# Patient Record
Sex: Female | Born: 1975 | Race: White | Hispanic: No | Marital: Single | Smoking: Never smoker
Health system: Southern US, Community
[De-identification: ages and names within clinical notes are randomized; demographics above are authoritative.]

## PROBLEM LIST (undated history)

## (undated) DIAGNOSIS — F329 Major depressive disorder, single episode, unspecified: Secondary | ICD-10-CM

## (undated) DIAGNOSIS — G4730 Sleep apnea, unspecified: Secondary | ICD-10-CM

## (undated) DIAGNOSIS — I10 Essential (primary) hypertension: Secondary | ICD-10-CM

## (undated) DIAGNOSIS — R569 Unspecified convulsions: Secondary | ICD-10-CM

## (undated) DIAGNOSIS — F32A Depression, unspecified: Secondary | ICD-10-CM

---
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
## 2005-08-02 ENCOUNTER — Emergency Department: Payer: Self-pay | Admitting: Emergency Medicine

## 2006-01-21 ENCOUNTER — Emergency Department: Payer: Self-pay | Admitting: Unknown Physician Specialty

## 2006-04-18 ENCOUNTER — Emergency Department: Payer: Self-pay | Admitting: Emergency Medicine

## 2007-12-29 ENCOUNTER — Emergency Department: Payer: Self-pay | Admitting: Emergency Medicine

## 2012-02-17 DIAGNOSIS — F32A Depression, unspecified: Secondary | ICD-10-CM | POA: Insufficient documentation

## 2012-03-11 DIAGNOSIS — G43909 Migraine, unspecified, not intractable, without status migrainosus: Secondary | ICD-10-CM | POA: Insufficient documentation

## 2012-03-11 DIAGNOSIS — I10 Essential (primary) hypertension: Secondary | ICD-10-CM | POA: Insufficient documentation

## 2012-06-14 ENCOUNTER — Ambulatory Visit: Payer: Self-pay | Admitting: Neurology

## 2014-02-24 DIAGNOSIS — E781 Pure hyperglyceridemia: Secondary | ICD-10-CM | POA: Insufficient documentation

## 2015-04-16 LAB — HM HIV SCREENING LAB: HM HIV Screening: NEGATIVE

## 2015-04-25 DIAGNOSIS — Z8619 Personal history of other infectious and parasitic diseases: Secondary | ICD-10-CM | POA: Insufficient documentation

## 2016-09-16 ENCOUNTER — Other Ambulatory Visit: Payer: Self-pay | Admitting: Family Medicine

## 2016-09-16 DIAGNOSIS — Z1231 Encounter for screening mammogram for malignant neoplasm of breast: Secondary | ICD-10-CM

## 2016-09-24 ENCOUNTER — Ambulatory Visit
Admission: RE | Admit: 2016-09-24 | Discharge: 2016-09-24 | Disposition: A | Discharge status: Home / Self Care | Payer: BC Managed Care – PPO | Source: Ambulatory Visit | Attending: Family Medicine | Admitting: Family Medicine

## 2016-09-24 ENCOUNTER — Encounter: Payer: Self-pay | Admitting: Radiology

## 2016-09-24 DIAGNOSIS — Z1231 Encounter for screening mammogram for malignant neoplasm of breast: Secondary | ICD-10-CM | POA: Diagnosis present

## 2018-01-19 ENCOUNTER — Other Ambulatory Visit: Payer: Self-pay | Admitting: Family Medicine

## 2018-01-19 DIAGNOSIS — Z1231 Encounter for screening mammogram for malignant neoplasm of breast: Secondary | ICD-10-CM

## 2018-02-03 ENCOUNTER — Ambulatory Visit
Admission: RE | Admit: 2018-02-03 | Discharge: 2018-02-03 | Disposition: A | Discharge status: Home / Self Care | Payer: BC Managed Care – PPO | Source: Ambulatory Visit | Attending: Family Medicine | Admitting: Family Medicine

## 2018-02-03 DIAGNOSIS — Z1231 Encounter for screening mammogram for malignant neoplasm of breast: Secondary | ICD-10-CM | POA: Insufficient documentation

## 2018-02-03 DIAGNOSIS — R928 Other abnormal and inconclusive findings on diagnostic imaging of breast: Secondary | ICD-10-CM | POA: Insufficient documentation

## 2018-02-08 ENCOUNTER — Other Ambulatory Visit: Payer: Self-pay | Admitting: Family Medicine

## 2018-02-08 DIAGNOSIS — R928 Other abnormal and inconclusive findings on diagnostic imaging of breast: Secondary | ICD-10-CM

## 2018-02-08 DIAGNOSIS — N6489 Other specified disorders of breast: Secondary | ICD-10-CM

## 2018-02-10 ENCOUNTER — Other Ambulatory Visit: Payer: Self-pay | Admitting: Neurology

## 2018-02-10 DIAGNOSIS — R569 Unspecified convulsions: Secondary | ICD-10-CM

## 2018-02-11 ENCOUNTER — Ambulatory Visit
Admission: RE | Admit: 2018-02-11 | Discharge: 2018-02-11 | Disposition: A | Discharge status: Home / Self Care | Payer: BC Managed Care – PPO | Source: Ambulatory Visit | Attending: Family Medicine | Admitting: Family Medicine

## 2018-02-11 DIAGNOSIS — N6489 Other specified disorders of breast: Secondary | ICD-10-CM | POA: Insufficient documentation

## 2018-02-11 DIAGNOSIS — R928 Other abnormal and inconclusive findings on diagnostic imaging of breast: Secondary | ICD-10-CM

## 2018-02-17 ENCOUNTER — Ambulatory Visit: Payer: BC Managed Care – PPO

## 2018-03-04 DIAGNOSIS — G40409 Other generalized epilepsy and epileptic syndromes, not intractable, without status epilepticus: Secondary | ICD-10-CM | POA: Insufficient documentation

## 2018-03-05 ENCOUNTER — Ambulatory Visit
Admission: RE | Admit: 2018-03-05 | Discharge: 2018-03-05 | Disposition: A | Discharge status: Home / Self Care | Payer: BC Managed Care – PPO | Source: Ambulatory Visit | Attending: Neurology | Admitting: Neurology

## 2018-03-05 DIAGNOSIS — R569 Unspecified convulsions: Secondary | ICD-10-CM | POA: Insufficient documentation

## 2018-03-05 LAB — POCT I-STAT CREATININE: CREATININE: 0.7 mg/dL (ref 0.44–1.00)

## 2018-03-05 MED ORDER — GADOBENATE DIMEGLUMINE 529 MG/ML IV SOLN
15.0000 mL | Freq: Once | INTRAVENOUS | Status: AC | PRN
  Administered 2018-03-05: 15 mL via INTRAVENOUS

## 2018-05-17 DIAGNOSIS — B001 Herpesviral vesicular dermatitis: Secondary | ICD-10-CM | POA: Insufficient documentation

## 2018-09-23 ENCOUNTER — Other Ambulatory Visit: Payer: Self-pay | Admitting: Family Medicine

## 2018-09-23 DIAGNOSIS — R928 Other abnormal and inconclusive findings on diagnostic imaging of breast: Secondary | ICD-10-CM

## 2018-10-12 ENCOUNTER — Ambulatory Visit
Admission: RE | Admit: 2018-10-12 | Discharge: 2018-10-12 | Disposition: A | Discharge status: Home / Self Care | Payer: BC Managed Care – PPO | Source: Ambulatory Visit | Attending: Family Medicine | Admitting: Family Medicine

## 2018-10-12 DIAGNOSIS — R928 Other abnormal and inconclusive findings on diagnostic imaging of breast: Secondary | ICD-10-CM

## 2018-10-13 ENCOUNTER — Ambulatory Visit: Payer: BC Managed Care – PPO | Attending: Neurology

## 2018-10-13 DIAGNOSIS — G4733 Obstructive sleep apnea (adult) (pediatric): Secondary | ICD-10-CM | POA: Insufficient documentation

## 2018-10-13 DIAGNOSIS — G4710 Hypersomnia, unspecified: Secondary | ICD-10-CM | POA: Diagnosis present

## 2018-10-13 DIAGNOSIS — G4730 Sleep apnea, unspecified: Secondary | ICD-10-CM | POA: Diagnosis present

## 2019-05-18 ENCOUNTER — Other Ambulatory Visit: Payer: Self-pay | Admitting: Family Medicine

## 2019-05-18 DIAGNOSIS — Z1231 Encounter for screening mammogram for malignant neoplasm of breast: Secondary | ICD-10-CM

## 2019-07-01 ENCOUNTER — Ambulatory Visit
Admission: RE | Admit: 2019-07-01 | Discharge: 2019-07-01 | Disposition: A | Discharge status: Home / Self Care | Payer: BC Managed Care – PPO | Source: Ambulatory Visit | Attending: Family Medicine | Admitting: Family Medicine

## 2019-07-01 ENCOUNTER — Other Ambulatory Visit: Payer: Self-pay

## 2019-07-01 DIAGNOSIS — Z1231 Encounter for screening mammogram for malignant neoplasm of breast: Secondary | ICD-10-CM | POA: Diagnosis present

## 2020-01-28 ENCOUNTER — Ambulatory Visit: Payer: BC Managed Care – PPO

## 2020-04-20 ENCOUNTER — Ambulatory Visit (INDEPENDENT_AMBULATORY_CARE_PROVIDER_SITE_OTHER): Payer: BC Managed Care – PPO

## 2020-04-20 ENCOUNTER — Ambulatory Visit
Admission: EM | Admit: 2020-04-20 | Discharge: 2020-04-20 | Disposition: A | Discharge status: Home / Self Care | Payer: BC Managed Care – PPO

## 2020-04-20 ENCOUNTER — Other Ambulatory Visit: Payer: Self-pay

## 2020-04-20 DIAGNOSIS — J209 Acute bronchitis, unspecified: Secondary | ICD-10-CM | POA: Diagnosis not present

## 2020-04-20 HISTORY — DX: Depression, unspecified: F32.A

## 2020-04-20 HISTORY — DX: Unspecified convulsions: R56.9

## 2020-04-20 HISTORY — DX: Essential (primary) hypertension: I10

## 2020-04-20 MED ORDER — DOXYCYCLINE HYCLATE 100 MG PO CAPS
100.0000 mg | ORAL_CAPSULE | Freq: Two times a day (BID) | ORAL | 0 refills | Status: AC
Start: 2020-04-20 — End: 2020-04-27

## 2020-04-20 MED ORDER — PROMETHAZINE-DM 6.25-15 MG/5ML PO SYRP
5.0000 mL | ORAL_SOLUTION | Freq: Four times a day (QID) | ORAL | 0 refills | Status: DC | PRN
Start: 2020-04-20 — End: 2024-03-22

## 2020-04-20 MED ORDER — ALBUTEROL SULFATE HFA 108 (90 BASE) MCG/ACT IN AERS: 1.0000 | INHALATION_SPRAY | Freq: Four times a day (QID) | RESPIRATORY_TRACT | 0 refills | Status: DC | PRN

## 2020-04-20 MED ORDER — DM-GUAIFENESIN ER 30-600 MG PO TB12
1.0000 | ORAL_TABLET | Freq: Two times a day (BID) | ORAL | 0 refills | Status: AC
Start: 2020-04-20 — End: 2020-04-30

## 2020-04-20 MED ORDER — PREDNISONE 20 MG PO TABS
40.0000 mg | ORAL_TABLET | Freq: Every day | ORAL | 0 refills | Status: AC
Start: 2020-04-20 — End: 2020-04-25

## 2020-04-20 NOTE — ED Provider Notes (Signed)
[facility]    CSN: [id number] Arrival date & time: 04/20/20  0908      History   Chief Complaint Chief Complaint  Patient presents with  . Cough    HPI Benna Pinkus is a 44 y.o. female.   Subjective:   Deatra Zollinger is a 44 y.o. female here for evaluation of a cough. Onset of symptoms was 1 week ago and has been gradually worsening since that time.  The cough is aggravated by reclining position. The cough is productive with thick yellow sputum.  Associated symptoms include shortness of breath and the inability to take a deep breath.  She denies any fevers, sweats, chills, wheezing, sore throat, nausea, vomiting, diarrhea, headache, leg swelling, dizziness or change in taste/smell.  She denies any known exposure to COVID-19.  She has never had COVID-19.  She has had full COVID vaccination with the second dose occurring on 03/21/2020.  Patient does not have a history of asthma. Patient has not had recent travel. Patient does not have a history of smoking. Patient  has not had a previous chest x-ray.   The following portions of the patient's history were reviewed and updated as appropriate: allergies, current medications, past family history, past medical history, past social history, past surgical history and problem list.       Past Medical History:  Diagnosis Date  . Depression   . Hypertension   . Seizures (HCC)     There are no problems to display for this patient.   History reviewed. No pertinent surgical history.  OB History   No obstetric history on file.      Home Medications    Prior to Admission medications   Medication Sig Start Date End Date Taking? Authorizing Provider  albuterol (VENTOLIN HFA) 108 (90 Base) MCG/ACT inhaler Inhale 1-2 puffs into the lungs every 6 (six) hours as needed for wheezing or shortness of breath. 04/20/20   Lurline Idol, FNP  dextromethorphan-guaiFENesin [facility] DM) 30-600 MG 12hr tablet Take 1 tablet by mouth 2  (two) times daily for 10 days. 04/20/20 04/30/20  Lurline Idol, FNP  doxycycline (VIBRAMYCIN) 100 MG capsule Take 1 capsule (100 mg total) by mouth 2 (two) times daily for 7 days. 04/20/20 04/27/20  Lurline Idol, FNP  EPIDUO FORTE 0.3-2.5 % GEL Apply 1 application topically at bedtime. 01/30/20   [provider]  levETIRAcetam (KEPPRA) 500 MG tablet Take 2 tablets in the morning and 1 tablet in the evening 02/11/20   [provider]  levonorgestrel-ethinyl estradiol (SEASONALE) 0.15-0.03 MG tablet Take 1 tablet by mouth daily. 12/19/19   [provider]  lisinopril-hydrochlorothiazide (ZESTORETIC) 10-12.5 MG tablet Take 1 tablet by mouth daily. 02/11/20   [provider]  predniSONE (DELTASONE) 20 MG tablet Take 2 tablets (40 mg total) by mouth daily for 5 days. 04/20/20 04/25/20  Lurline Idol, FNP  promethazine-dextromethorphan (PROMETHAZINE-DM) 6.25-15 MG/5ML syrup Take 5 mLs by mouth 4 (four) times daily as needed for cough. 04/20/20   Lurline Idol, FNP  venlafaxine XR (EFFEXOR-XR) 150 MG 24 hr capsule Take 150 mg by mouth daily. 03/19/20   [provider]    Family History Family History  Problem Relation Age of Onset  . Hypertension Mother   . Breast cancer Neg Hx     Social History Social History   Tobacco Use  . Smoking status: Never Smoker  . Smokeless tobacco: Never Used  Substance Use Topics  . Alcohol use: Yes  . Drug use:  Never     Allergies   Patient has no known allergies.   Review of Systems Review of Systems  Constitutional: Negative for fever.  HENT: Negative for congestion, rhinorrhea, sinus pressure, sneezing and sore throat.   Eyes: Negative.   Respiratory: Positive for cough, chest tightness and shortness of breath. Negative for wheezing.   Cardiovascular: Negative for palpitations and leg swelling.  Gastrointestinal: Negative for diarrhea, nausea and vomiting.  Skin: Negative for rash.  Neurological:  Negative for dizziness and headaches.  All other systems reviewed and are negative.    Physical Exam Triage Vital Signs ED Triage Vitals  Enc Vitals Group     BP 04/20/20 0927 128/82     Pulse Rate 04/20/20 0927 95     Resp 04/20/20 0927 18     Temp 04/20/20 0927 98.7 F (37.1 C)     Temp Source 04/20/20 0927 Oral     SpO2 04/20/20 0927 98 %     Weight 04/20/20 0923 170 lb (77.1 kg)     Height 04/20/20 0923 5\' 1"  (1.549 m)     Head Circumference --      Peak Flow --      Pain Score 04/20/20 0923 0     Pain Loc --      Pain Edu? --      Excl. in GC? --    No data found.  Updated Vital Signs BP 128/82 (BP Location: Left Arm)   Pulse 95   Temp 98.7 F (37.1 C) (Oral)   Resp 18   Ht 5\' 1"  (1.549 m)   Wt 170 lb (77.1 kg)   LMP 04/05/2020   SpO2 98%   BMI 32.12 kg/m   Visual Acuity Right Eye Distance:   Left Eye Distance:   Bilateral Distance:    Right Eye Near:   Left Eye Near:    Bilateral Near:     Physical Exam Vitals reviewed.  Constitutional:      General: She is not in acute distress.    Appearance: Normal appearance. She is not ill-appearing, toxic-appearing or diaphoretic.  HENT:     Head: Normocephalic.  Cardiovascular:     Rate and Rhythm: Normal rate and regular rhythm.  Pulmonary:     Effort: Pulmonary effort is normal.     Breath sounds: Normal breath sounds.  Musculoskeletal:        General: Normal range of motion.     Cervical back: Normal range of motion and neck supple. No tenderness.  Lymphadenopathy:     Cervical: No cervical adenopathy.  Skin:    General: Skin is warm and dry.  Neurological:     General: No focal deficit present.     Mental Status: She is alert and oriented to person, place, and time.  Psychiatric:        Mood and Affect: Mood normal.      UC Treatments / Results  Labs (all labs ordered are listed, but only abnormal results are displayed) Labs Reviewed - No data to display  EKG   Radiology DG Chest 2  View  Result Date: 04/20/2020 CLINICAL DATA:  Cough EXAM: CHEST - 2 VIEW COMPARISON:  None. FINDINGS: The heart size and mediastinal contours are within normal limits. Both lungs are clear. The visualized skeletal structures are unremarkable. IMPRESSION: No active cardiopulmonary disease. Electronically Signed   By: 04/07/2020.  Shick M.D.   On: 04/20/2020 09:52    Procedures Procedures (including critical care time)  Medications  Ordered in UC Medications - No data to display  Initial Impression / Assessment and Plan / UC Course  I have reviewed the triage vital signs and the nursing notes.  Pertinent labs & imaging results that were available during my care of the patient were reviewed by me and considered in my medical decision making (see chart for details).    44 yo female with a one-week history of progressively worsening productive cough. No fevers.  No known COVID-19 exposure.  No previous history of COVID-19.  Patient is fully vaccinated against COVID-19.  She is afebrile.  Vital signs stable.  Physical exam unremarkable.  Antibiotics, steroids and antitussives as per medication orders.  B agonist inhaler as needed. Avoid exposure to tobacco smoke and fumes. Follow-up with primary care next month as already scheduled or sooner if symptoms worsen, change in character of cough, development of fever/chills, inability to maintain nutrition or hydration.   Today's evaluation has revealed no signs of a dangerous process. Discussed diagnosis with patient and/or guardian. Patient and/or guardian aware of their diagnosis, possible red flag symptoms to watch out for and need for close follow up. Patient and/or guardian understands verbal and written discharge instructions. Patient and/or guardian comfortable with plan and disposition.  Patient and/or guardian has a clear mental status at this time, good insight into illness (after discussion and teaching) and has clear judgment to make decisions regarding  their care  This care was provided during an unprecedented National Emergency due to the Novel Coronavirus (COVID-19) pandemic. COVID-19 infections and transmission risks place heavy strains on healthcare resources.  As this pandemic evolves, our facility, providers, and staff strive to respond fluidly, to remain operational, and to provide care relative to available resources and information. Outcomes are unpredictable and treatments are without well-defined guidelines. Further, the impact of COVID-19 on all aspects of urgent care, including the impact to patients seeking care for reasons other than COVID-19, is unavoidable during this national emergency. At this time of the global pandemic, management of patients has significantly changed, even for non-COVID positive patients given high local and regional COVID volumes at this time requiring high healthcare system and resource utilization. The standard of care for management of both COVID suspected and non-COVID suspected patients continues to change rapidly at the local, regional, national, and global levels. This patient was worked up and treated to the best available but ever changing evidence and resources available at this current time.   Documentation was completed with the aid of voice recognition software. Transcription may contain typographical errors.  Final Clinical Impressions(s) / UC Diagnoses   Final diagnoses:  Acute bronchitis, unspecified organism     Discharge Instructions     There was no pneumonia noted on your chest x-ray.  We're going to start treatment today for acute bronchitis.  Take the medications as prescribed.  Drink plenty of fluids.  You may also take Tylenol and/or ibuprofen as needed for any pain.  Follow-up with your primary care provider in June as already scheduled or sooner if needed.  Go to the ED if worse.  Feel better soon Aldona Bar, [facility]      ED Prescriptions    Medication Sig Dispense Auth. Provider    doxycycline (VIBRAMYCIN) 100 MG capsule Take 1 capsule (100 mg total) by mouth 2 (two) times daily for 7 days. 14 capsule Enrique Sack, FNP   predniSONE (DELTASONE) 20 MG tablet Take 2 tablets (40 mg total) by mouth daily for 5 days. 10 tablet  Lurline Idol, FNP   dextromethorphan-guaiFENesin [facility] DM) 30-600 MG 12hr tablet Take 1 tablet by mouth 2 (two) times daily for 10 days. 20 tablet Lurline Idol, FNP   promethazine-dextromethorphan (PROMETHAZINE-DM) 6.25-15 MG/5ML syrup Take 5 mLs by mouth 4 (four) times daily as needed for cough. 118 mL Lurline Idol, FNP   albuterol (VENTOLIN HFA) 108 (90 Base) MCG/ACT inhaler Inhale 1-2 puffs into the lungs every 6 (six) hours as needed for wheezing or shortness of breath. 8 g Lurline Idol, FNP     PDMP not reviewed this encounter.   Lurline Idol, [place] 04/20/20 1013

## 2020-04-20 NOTE — ED Triage Notes (Addendum)
Pt presents with c/o cough for almost 2 weeks. Pt states it is worse in the mornings and very productive. She reports she has to prop herself up to sleep. She also states she has some wheezing. Pt has tried a DM cough syrup and vapor rub with no improvement. Pt denies fever/chills, n/v/d, sore throat or ear pain. Pt has had both COVID vaccines.

## 2020-04-20 NOTE — Discharge Instructions (Signed)
There was no pneumonia noted on your chest x-ray.  We're going to start treatment today for acute bronchitis.  Take the medications as prescribed.  Drink plenty of fluids.  You may also take Tylenol and/or ibuprofen as needed for any pain.  Follow-up with your primary care provider in June as already scheduled or sooner if needed.  Go to the ED if worse.  Feel better soon Lelon Mast, FNP-C

## 2020-05-31 ENCOUNTER — Other Ambulatory Visit: Payer: Self-pay

## 2020-05-31 DIAGNOSIS — Z1231 Encounter for screening mammogram for malignant neoplasm of breast: Secondary | ICD-10-CM

## 2020-05-31 LAB — HM PAP SMEAR: HM Pap smear: NORMAL

## 2020-05-31 LAB — RESULTS CONSOLE HPV: CHL HPV: NEGATIVE

## 2020-07-03 ENCOUNTER — Ambulatory Visit
Admission: RE | Admit: 2020-07-03 | Discharge: 2020-07-03 | Disposition: A | Discharge status: Home / Self Care | Payer: BC Managed Care – PPO | Source: Ambulatory Visit

## 2020-07-03 DIAGNOSIS — Z1231 Encounter for screening mammogram for malignant neoplasm of breast: Secondary | ICD-10-CM | POA: Diagnosis present

## 2021-05-21 ENCOUNTER — Other Ambulatory Visit: Payer: Self-pay

## 2021-05-21 DIAGNOSIS — Z1231 Encounter for screening mammogram for malignant neoplasm of breast: Secondary | ICD-10-CM

## 2021-06-13 LAB — HM HEPATITIS C SCREENING LAB: HM Hepatitis Screen: NEGATIVE

## 2021-07-04 ENCOUNTER — Ambulatory Visit
Admission: RE | Admit: 2021-07-04 | Discharge: 2021-07-04 | Disposition: A | Discharge status: Home / Self Care | Payer: BC Managed Care – PPO | Source: Ambulatory Visit

## 2021-07-04 ENCOUNTER — Other Ambulatory Visit: Payer: Self-pay

## 2021-07-04 DIAGNOSIS — Z1231 Encounter for screening mammogram for malignant neoplasm of breast: Secondary | ICD-10-CM | POA: Diagnosis present

## 2021-12-23 ENCOUNTER — Other Ambulatory Visit: Payer: Self-pay | Admitting: Physician Assistant

## 2021-12-23 DIAGNOSIS — Z1231 Encounter for screening mammogram for malignant neoplasm of breast: Secondary | ICD-10-CM

## 2021-12-23 DIAGNOSIS — R590 Localized enlarged lymph nodes: Secondary | ICD-10-CM

## 2022-01-13 ENCOUNTER — Ambulatory Visit
Admission: RE | Admit: 2022-01-13 | Discharge: 2022-01-13 | Disposition: A | Discharge status: Home / Self Care | Payer: BC Managed Care – PPO | Source: Ambulatory Visit | Attending: Physician Assistant | Admitting: Physician Assistant

## 2022-01-13 DIAGNOSIS — R590 Localized enlarged lymph nodes: Secondary | ICD-10-CM | POA: Insufficient documentation

## 2022-01-17 ENCOUNTER — Other Ambulatory Visit: Payer: Self-pay

## 2022-01-17 ENCOUNTER — Encounter: Payer: Self-pay | Admitting: Gastroenterology

## 2022-01-17 ENCOUNTER — Telehealth: Payer: Self-pay

## 2022-01-17 DIAGNOSIS — Z1211 Encounter for screening for malignant neoplasm of colon: Secondary | ICD-10-CM

## 2022-01-17 MED ORDER — NA SULFATE-K SULFATE-MG SULF 17.5-3.13-1.6 GM/177ML PO SOLN: 1.0000 | Freq: Once | ORAL | 0 refills | Status: AC

## 2022-01-17 NOTE — Progress Notes (Signed)
Gastroenterology Pre-Procedure Review  Request Date: 01/24/2022 Requesting Physician: Dr. Servando Snare  PATIENT REVIEW QUESTIONS: The patient responded to the following health history questions as indicated:    1. Are you having any GI issues? no 2. Do you have a personal history of Polyps? no 3. Do you have a family history of Colon Cancer or Polyps? no 4. Diabetes Mellitus? no 5. Joint replacements in the past 12 months?no 6. Major health problems in the past 3 months?no 7. Any artificial heart valves, MVP, or defibrillator?no    MEDICATIONS & ALLERGIES:    Patient reports the following regarding taking any anticoagulation/antiplatelet therapy:   Plavix, Coumadin, Eliquis, Xarelto, Lovenox, Pradaxa, Brilinta, or Effient? no Aspirin? no  Patient confirms/reports the following medications:  Current Outpatient Medications  Medication Sig Dispense Refill   albuterol (VENTOLIN HFA) 108 (90 Base) MCG/ACT inhaler Inhale 1-2 puffs into the lungs every 6 (six) hours as needed for wheezing or shortness of breath. 8 g 0   EPIDUO FORTE 0.3-2.5 % GEL Apply 1 application topically at bedtime.     levETIRAcetam (KEPPRA) 500 MG tablet Take 2 tablets in the morning and 1 tablet in the evening     levonorgestrel-ethinyl estradiol (SEASONALE) 0.15-0.03 MG tablet Take 1 tablet by mouth daily.     lisinopril-hydrochlorothiazide (ZESTORETIC) 10-12.5 MG tablet Take 1 tablet by mouth daily.     promethazine-dextromethorphan (PROMETHAZINE-DM) 6.25-15 MG/5ML syrup Take 5 mLs by mouth 4 (four) times daily as needed for cough. 118 mL 0   venlafaxine XR (EFFEXOR-XR) 150 MG 24 hr capsule Take 150 mg by mouth daily.     No current facility-administered medications for this visit.    Patient confirms/reports the following allergies:  No Known Allergies  No orders of the defined types were placed in this encounter.   AUTHORIZATION INFORMATION Primary Insurance: 1D#: Group #:  Secondary Insurance: 1D#: Group  #:  SCHEDULE INFORMATION: Date: 01/24/2022 Time: Location:[facility]

## 2022-01-17 NOTE — Telephone Encounter (Signed)
Scheduled for 01/24/2022

## 2022-02-10 ENCOUNTER — Ambulatory Visit: Payer: BC Managed Care – PPO | Admitting: Anesthesiology

## 2022-02-10 ENCOUNTER — Ambulatory Visit
Admission: RE | Disposition: A | Discharge status: Home / Self Care | Payer: Self-pay | Source: Home / Self Care | Attending: Gastroenterology

## 2022-02-10 ENCOUNTER — Other Ambulatory Visit: Payer: Self-pay

## 2022-02-10 ENCOUNTER — Ambulatory Visit
Admission: RE | Admit: 2022-02-10 | Discharge: 2022-02-10 | Disposition: A | Discharge status: Home / Self Care | Payer: BC Managed Care – PPO | Attending: Gastroenterology | Admitting: Gastroenterology

## 2022-02-10 ENCOUNTER — Encounter: Payer: Self-pay | Admitting: Gastroenterology

## 2022-02-10 DIAGNOSIS — K640 First degree hemorrhoids: Secondary | ICD-10-CM | POA: Insufficient documentation

## 2022-02-10 DIAGNOSIS — Z1211 Encounter for screening for malignant neoplasm of colon: Secondary | ICD-10-CM | POA: Insufficient documentation

## 2022-02-10 DIAGNOSIS — I10 Essential (primary) hypertension: Secondary | ICD-10-CM | POA: Insufficient documentation

## 2022-02-10 DIAGNOSIS — G4730 Sleep apnea, unspecified: Secondary | ICD-10-CM | POA: Diagnosis not present

## 2022-02-10 DIAGNOSIS — K635 Polyp of colon: Secondary | ICD-10-CM | POA: Diagnosis not present

## 2022-02-10 DIAGNOSIS — F32A Depression, unspecified: Secondary | ICD-10-CM | POA: Diagnosis not present

## 2022-02-10 DIAGNOSIS — R569 Unspecified convulsions: Secondary | ICD-10-CM | POA: Diagnosis not present

## 2022-02-10 HISTORY — PX: COLONOSCOPY WITH PROPOFOL: SHX5780

## 2022-02-10 HISTORY — PX: POLYPECTOMY: SHX5525

## 2022-02-10 HISTORY — DX: Sleep apnea, unspecified: G47.30

## 2022-02-10 LAB — POCT PREGNANCY, URINE: Preg Test, Ur: NEGATIVE

## 2022-02-10 SURGERY — COLONOSCOPY WITH PROPOFOL
Anesthesia: General | Site: Rectum

## 2022-02-10 MED ORDER — ONDANSETRON HCL 4 MG/2ML IJ SOLN: 4.0000 mg | Freq: Once | INTRAMUSCULAR | Status: DC | PRN

## 2022-02-10 MED ORDER — LIDOCAINE HCL (CARDIAC) PF 100 MG/5ML IV SOSY
PREFILLED_SYRINGE | INTRAVENOUS | Status: DC | PRN
  Administered 2022-02-10: 30 mg via INTRAVENOUS

## 2022-02-10 MED ORDER — ACETAMINOPHEN 160 MG/5ML PO SOLN: 975.0000 mg | Freq: Once | ORAL | Status: DC | PRN

## 2022-02-10 MED ORDER — STERILE WATER FOR IRRIGATION IR SOLN
Status: DC | PRN
  Administered 2022-02-10: 1

## 2022-02-10 MED ORDER — LACTATED RINGERS IV SOLN: INTRAVENOUS | Status: DC

## 2022-02-10 MED ORDER — ACETAMINOPHEN 500 MG PO TABS: 1000.0000 mg | ORAL_TABLET | Freq: Once | ORAL | Status: DC | PRN

## 2022-02-10 MED ORDER — SODIUM CHLORIDE 0.9 % IV SOLN: INTRAVENOUS | Status: DC

## 2022-02-10 MED ORDER — PROPOFOL 10 MG/ML IV BOLUS
INTRAVENOUS | Status: DC | PRN
  Administered 2022-02-10 (×2): 40 mg via INTRAVENOUS
  Administered 2022-02-10: 140 mg via INTRAVENOUS
  Administered 2022-02-10 (×2): 40 mg via INTRAVENOUS

## 2022-02-10 SURGICAL SUPPLY — 8 items
GOWN CVR UNV OPN BCK APRN NK (MISCELLANEOUS) ×2 IMPLANT
GOWN ISOL THUMB LOOP REG UNIV (MISCELLANEOUS) ×4
KIT PRC NS LF DISP ENDO (KITS) ×1 IMPLANT
KIT PROCEDURE OLYMPUS (KITS) ×2
MANIFOLD NEPTUNE II (INSTRUMENTS) ×2 IMPLANT
SNARE COLD EXACTO (MISCELLANEOUS) ×1 IMPLANT
TRAP ETRAP POLY (MISCELLANEOUS) ×1 IMPLANT
WATER STERILE IRR 250ML POUR (IV SOLUTION) ×2 IMPLANT

## 2022-02-10 NOTE — Anesthesia Procedure Notes (Signed)
Date/Time: 02/10/2022 7:45 AM Performed by: Maree Krabbe, CRNA Pre-anesthesia Checklist: Patient identified, Emergency Drugs available, Suction available, Timeout performed and Patient being monitored Patient Re-evaluated:Patient Re-evaluated prior to induction Oxygen Delivery Method: Nasal cannula Placement Confirmation: positive ETCO2

## 2022-02-10 NOTE — Anesthesia Postprocedure Evaluation (Signed)
Anesthesia Post Note  Patient: Deborah Coleman  Procedure(s) Performed: COLONOSCOPY WITH BIOPSY (Rectum) POLYPECTOMY (Rectum)     Patient location during evaluation: PACU Anesthesia Type: General Level of consciousness: awake and alert Pain management: pain level controlled Vital Signs Assessment: post-procedure vital signs reviewed and stable Respiratory status: spontaneous breathing, nonlabored ventilation, respiratory function stable and patient connected to nasal cannula oxygen Cardiovascular status: blood pressure returned to baseline and stable Postop Assessment: no apparent nausea or vomiting Anesthetic complications: no   No notable events documented.  Loni Beckwith

## 2022-02-10 NOTE — H&P (Signed)
Midge Minium, MD [facility] [address], [address] [place], [place] [address] Phone: [phone] Fax : [phone]  Primary Care Physician:  Marland Kitchen, FNP Primary Gastroenterologist:  Dr. Servando Snare  Pre-Procedure History & Physical: HPI:  Deborah Coleman is a 46 y.o. female is here for a screening colonoscopy.   Past Medical History:  Diagnosis Date   Depression    Hypertension    Seizures (HCC)    not for 10 years   Sleep apnea    no CPAP    History reviewed. No pertinent surgical history.  Prior to Admission medications   Medication Sig Start Date End Date Taking? Authorizing Provider  levETIRAcetam (KEPPRA) 500 MG tablet Take 2 tablets in the morning and 1 tablet in the evening 02/11/20  Yes [provider]  lisinopril-hydrochlorothiazide (ZESTORETIC) 10-12.5 MG tablet Take 1 tablet by mouth daily. 02/11/20  Yes [provider]  venlafaxine XR (EFFEXOR-XR) 150 MG 24 hr capsule Take 150 mg by mouth daily. 03/19/20  Yes [provider]  albuterol (VENTOLIN HFA) 108 (90 Base) MCG/ACT inhaler Inhale 1-2 puffs into the lungs every 6 (six) hours as needed for wheezing or shortness of breath. Patient not taking: Reported on 01/17/2022 04/20/20   Lurline Idol, FNP  EPIDUO FORTE 0.3-2.5 % GEL Apply 1 application topically at bedtime. Patient not taking: Reported on 01/17/2022 01/30/20   [provider]  levonorgestrel-ethinyl estradiol (SEASONALE) 0.15-0.03 MG tablet Take 1 tablet by mouth daily. Patient not taking: Reported on 01/17/2022 12/19/19   [provider]  promethazine-dextromethorphan (PROMETHAZINE-DM) 6.25-15 MG/5ML syrup Take 5 mLs by mouth 4 (four) times daily as needed for cough. Patient not taking: Reported on 01/17/2022 04/20/20   Lurline Idol, FNP    Allergies as of 01/17/2022   (No Known Allergies)    Family History  Problem Relation Age of Onset   Hypertension Mother    Breast cancer Neg Hx     Social History    Socioeconomic History   Marital status: Single    Spouse name: Not on file   Number of children: Not on file   Years of education: Not on file   Highest education level: Not on file  Occupational History   Not on file  Tobacco Use   Smoking status: Never   Smokeless tobacco: Never  Vaping Use   Vaping Use: Never used  Substance and Sexual Activity   Alcohol use: Yes    Comment: occasionally   Drug use: Never   Sexual activity: Not on file  Other Topics Concern   Not on file  Social History Narrative   Not on file   Social Determinants of Health   Financial Resource Strain: Not on file  Food Insecurity: Not on file  Transportation Needs: Not on file  Physical Activity: Not on file  Stress: Not on file  Social Connections: Not on file  Intimate Partner Violence: Not on file    Review of Systems: See HPI, otherwise negative ROS  Physical Exam: BP 104/71    Pulse 75    Temp 98.2 F (36.8 C) (Temporal)    Ht 5' (1.524 m)    Wt 76.7 kg    SpO2 98%    BMI 33.01 kg/m  General:   Alert,  pleasant and cooperative in NAD Head:  Normocephalic and atraumatic. Neck:  Supple; no masses or thyromegaly. Lungs:  Clear throughout to auscultation.    Heart:  Regular rate and rhythm. Abdomen:  Soft, nontender and nondistended.  Normal bowel sounds, without guarding, and without rebound.   Neurologic:  Alert and  oriented x4;  grossly normal neurologically.  Impression/Plan: Deborah Coleman is now here to undergo a screening colonoscopy.  Risks, benefits, and alternatives regarding colonoscopy have been reviewed with the patient.  Questions have been answered.  All parties agreeable.

## 2022-02-10 NOTE — Op Note (Signed)
[facility] Gastroenterology Patient Name: [facility] Procedure Date: 02/10/2022 7:22 AM MRN: [id number] Account #: [id number] Date of Birth: 1976-09-20 Admit Type: Outpatient Age: 46 Room: [facility] OR ROOM 01 Gender: Female Note Status: Finalized Instrument Name: [id number] Procedure:             Colonoscopy Indications:           Screening for colorectal malignant neoplasm Providers:             Midge Minium MD, MD Referring MD:          Harrison Mons Medicines:             Propofol per Anesthesia Complications:         No immediate complications. Procedure:             Pre-Anesthesia Assessment:                        - Prior to the procedure, a History and Physical was                         performed, and patient medications and allergies were                         reviewed. The patient's tolerance of previous                         anesthesia was also reviewed. The risks and benefits                         of the procedure and the sedation options and risks                         were discussed with the patient. All questions were                         answered, and informed consent was obtained. Prior                         Anticoagulants: The patient has taken no previous                         anticoagulant or antiplatelet agents. ASA Grade                         Assessment: II - A patient with mild systemic disease.                         After reviewing the risks and benefits, the patient                         was deemed in satisfactory condition to undergo the                         procedure.                        After obtaining informed consent, the colonoscope was  passed under direct vision. Throughout the procedure,                         the patient's blood pressure, pulse, and oxygen                         saturations were monitored continuously. The                         Colonoscope was introduced through  the anus and                         advanced to the the cecum, identified by appendiceal                         orifice and ileocecal valve. The colonoscopy was                         performed without difficulty. The patient tolerated                         the procedure well. The quality of the bowel                         preparation was excellent. Findings:      The perianal and digital rectal examinations were normal.      A 4 mm polyp was found in the descending colon. The polyp was sessile.       The polyp was removed with a cold snare. Resection and retrieval were       complete.      Non-bleeding internal hemorrhoids were found during retroflexion. The       hemorrhoids were Grade I (internal hemorrhoids that do not prolapse). Impression:            - One 4 mm polyp in the descending colon, removed with                         a cold snare. Resected and retrieved.                        - Non-bleeding internal hemorrhoids. Recommendation:        - Discharge patient to home.                        - Resume previous diet.                        - Continue present medications.                        - Await pathology results.                        - If the pathology report reveals adenomatous tissue,                         then repeat the colonoscopy for surveillance in 7                         years  otherwise 10 years. Procedure Code(s):     --- Professional ---                        [phone], Colonoscopy, flexible; with removal of                         tumor(s), polyp(s), or other lesion(s) by snare                         technique Diagnosis Code(s):     --- Professional ---                        Z12.11, Encounter for screening for malignant neoplasm                         of colon                        K63.5, Polyp of colon CPT copyright 2019 American Medical Association. All rights reserved. The codes documented in this report are preliminary and upon coder  review may  be revised to meet current compliance requirements. Midge Minium MD, MD 02/10/2022 7:49:58 AM This report has been signed electronically. Number of Addenda: 0 Note Initiated On: 02/10/2022 7:22 AM Scope Withdrawal Time: 0 hours 6 minutes 7 seconds  Total Procedure Duration: 0 hours 9 minutes 59 seconds  Estimated Blood Loss:  Estimated blood loss: none.      Edward [place]

## 2022-02-10 NOTE — Transfer of Care (Signed)
Immediate Anesthesia Transfer of Care Note  Patient: Deborah Coleman  Procedure(s) Performed: COLONOSCOPY WITH BIOPSY (Rectum) POLYPECTOMY (Rectum)  Patient Location: PACU  Anesthesia Type: MAC  Level of Consciousness: awake, alert  and patient cooperative  Airway and Oxygen Therapy: Patient Spontanous Breathing and Patient connected to supplemental oxygen  Post-op Assessment: Post-op Vital signs reviewed, Patient's Cardiovascular Status Stable, Respiratory Function Stable, Patent Airway and No signs of Nausea or vomiting  Post-op Vital Signs: Reviewed and stable  Complications: No notable events documented.

## 2022-02-10 NOTE — Anesthesia Preprocedure Evaluation (Addendum)
Anesthesia Evaluation  Patient identified by MRN, date of birth, ID band Patient awake    Reviewed: Allergy & Precautions, H&P , NPO status , Patient's Chart, lab work & pertinent test results, reviewed documented beta blocker date and time   Airway Mallampati: II  TM Distance: >3 FB Neck ROM: full    Dental no notable dental hx.    Pulmonary neg pulmonary ROS, sleep apnea ,    Pulmonary exam normal breath sounds clear to auscultation       Cardiovascular Exercise Tolerance: Good hypertension, negative cardio ROS   Rhythm:regular Rate:Normal     Neuro/Psych Seizures - (tonic-clonic in the past (>74yr ago), still some more recent focal seizures but improved control with med adjustment (keppra maintenance). Focal events present as a few minutes of language disruption with some very mild post-ictal changes),  PSYCHIATRIC DISORDERS Depression negative psych ROS   GI/Hepatic negative GI ROS, Neg liver ROS,   Endo/Other  negative endocrine ROS  Renal/GU negative Renal ROS  negative genitourinary   Musculoskeletal   Abdominal   Peds  Hematology negative hematology ROS (+)   Anesthesia Other Findings BMI >30  Reproductive/Obstetrics negative OB ROS                            Anesthesia Physical Anesthesia Plan  ASA: 2  Anesthesia Plan: General   Post-op Pain Management:    Induction:   PONV Risk Score and Plan: 2 and TIVA, Propofol infusion and Treatment may vary due to age or medical condition  Airway Management Planned:   Additional Equipment:   Intra-op Plan:   Post-operative Plan:   Informed Consent: I have reviewed the patients History and Physical, chart, labs and discussed the procedure including the risks, benefits and alternatives for the proposed anesthesia with the patient or authorized representative who has indicated his/her understanding and acceptance.     Dental  Advisory Given  Plan Discussed with: CRNA  Anesthesia Plan Comments:        Anesthesia Quick Evaluation

## 2022-02-11 ENCOUNTER — Encounter: Payer: Self-pay | Admitting: Gastroenterology

## 2022-02-11 LAB — SURGICAL PATHOLOGY

## 2022-02-12 ENCOUNTER — Encounter: Payer: Self-pay | Admitting: Gastroenterology

## 2023-06-13 IMAGING — MG MM DIGITAL SCREENING BILAT W/ TOMO AND CAD
8 series · 8 of 24 positions shown · non-contrast
Comparison: Previous exam(s).

CLINICAL DATA: Screening.

EXAM:
DIGITAL SCREENING BILATERAL MAMMOGRAM WITH TOMOSYNTHESIS AND CAD
TECHNIQUE: Bilateral screening digital craniocaudal and mediolateral oblique
mammograms were obtained. Bilateral screening digital breast
tomosynthesis was performed. The images were evaluated with
computer-aided detection.

[R MLO synth-2D]
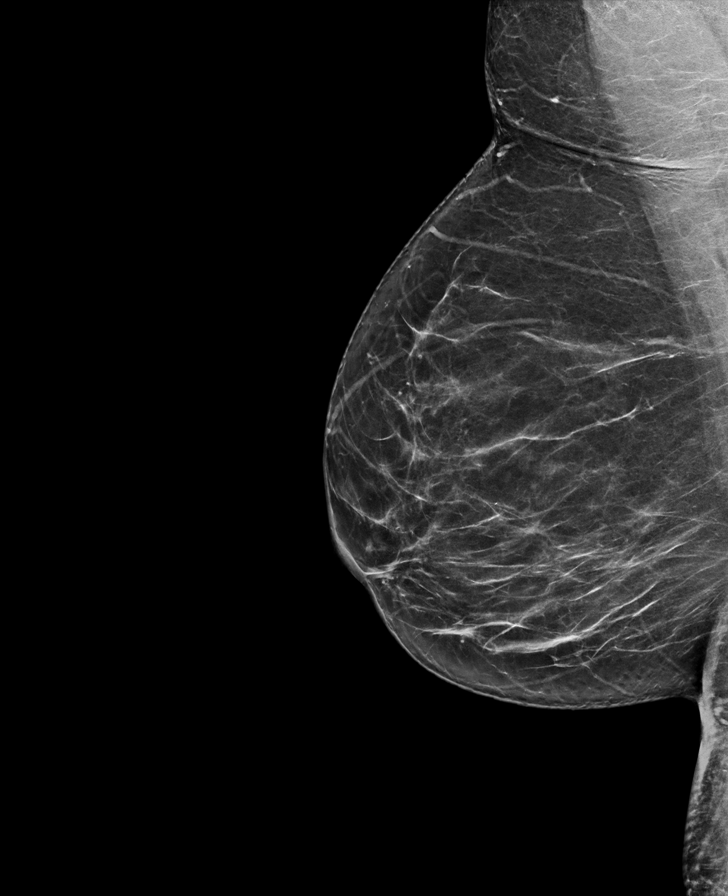

[R CC synth-2D]
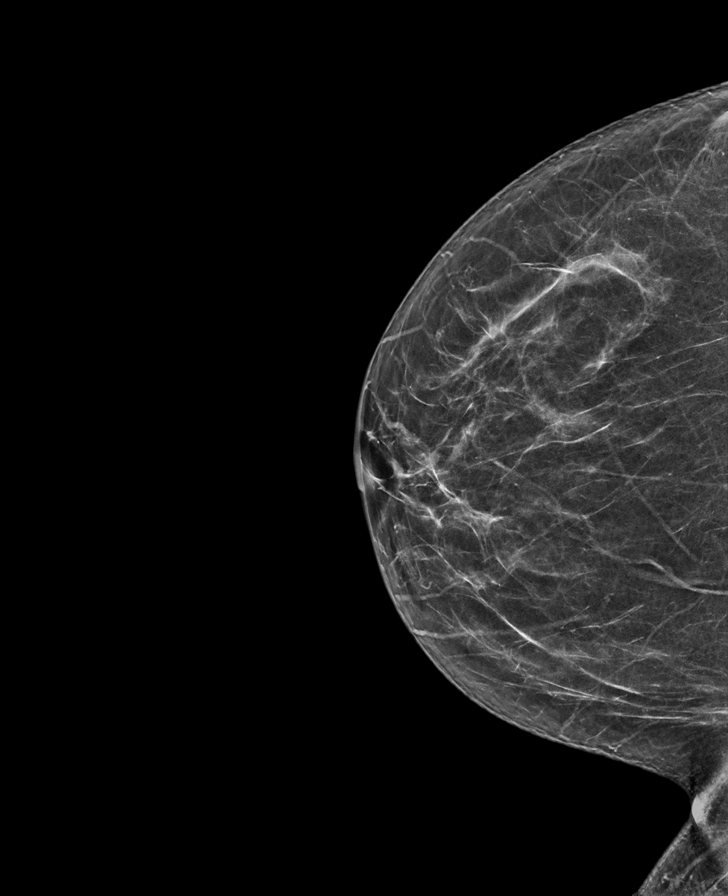

[L CC synth-2D]
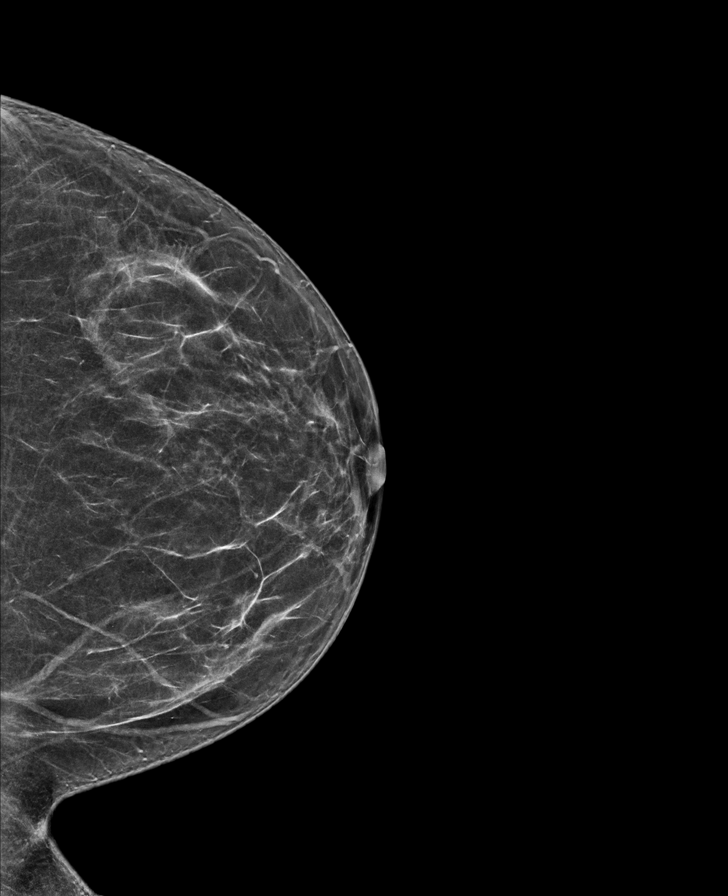

[L MLO synth-2D]
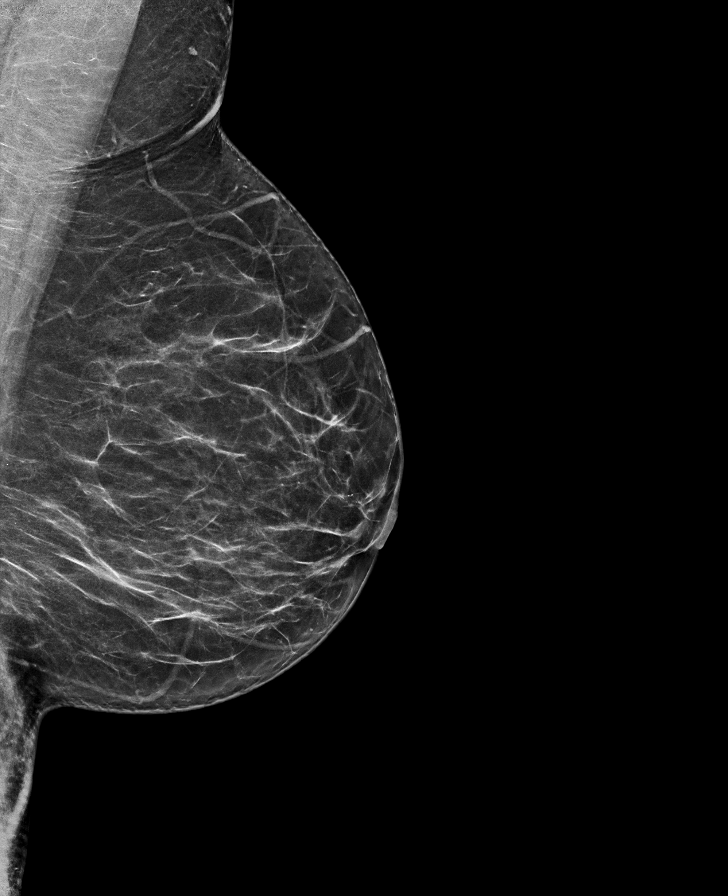

[L CC tomo · tomo slice 32/63.0]
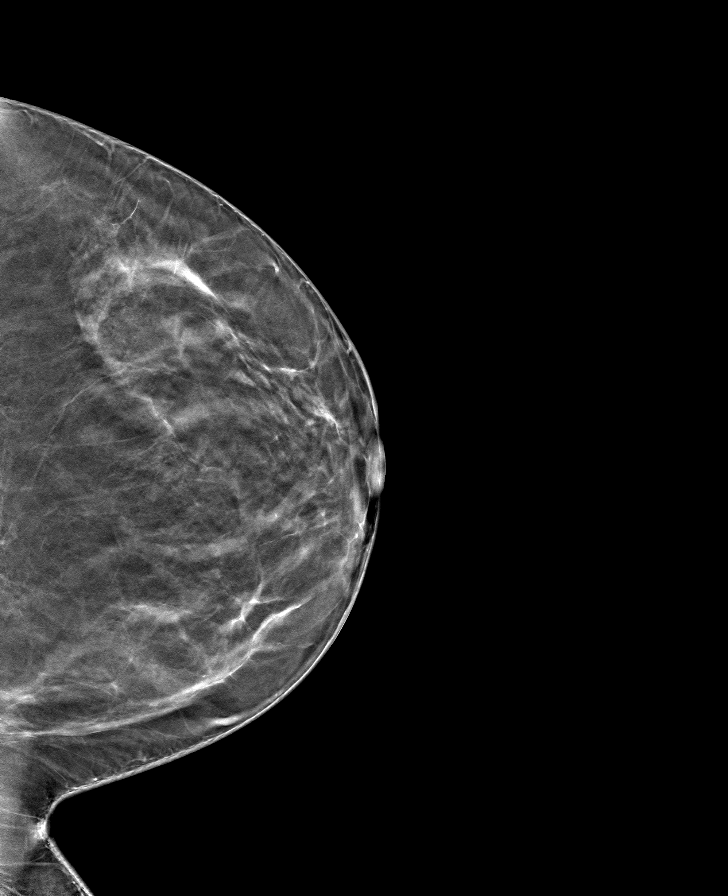

[R MLO tomo · tomo slice 37/72.0]
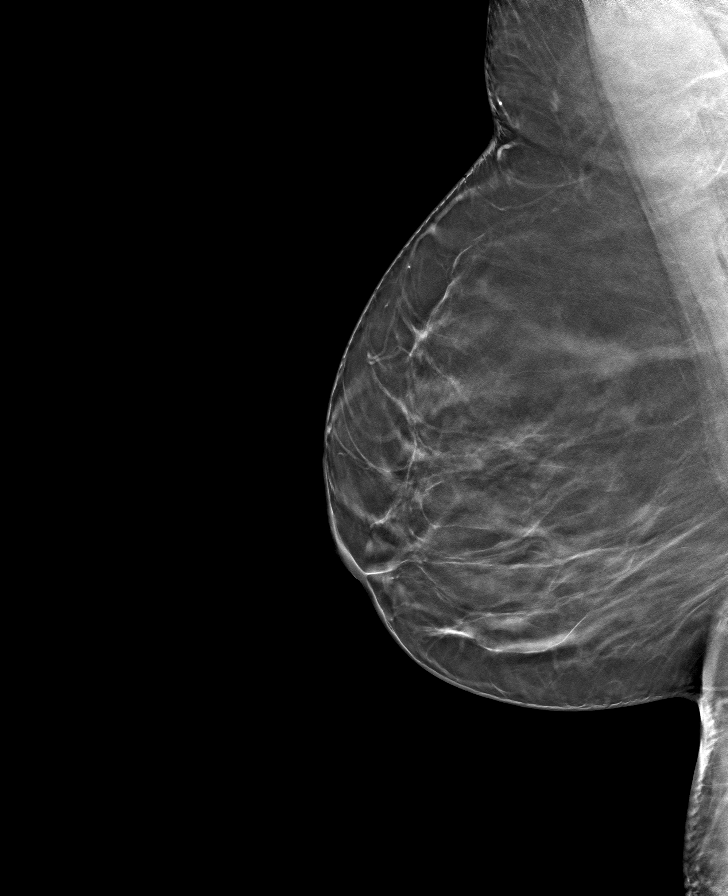

[L MLO tomo · tomo slice 35/70.0]
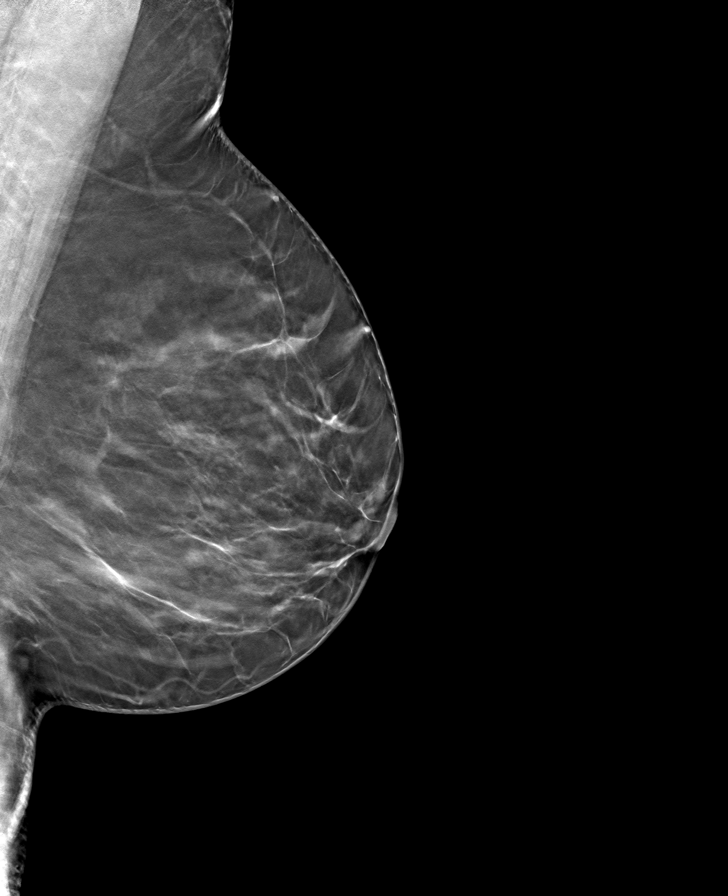

[R CC tomo · tomo slice 32/63.0]
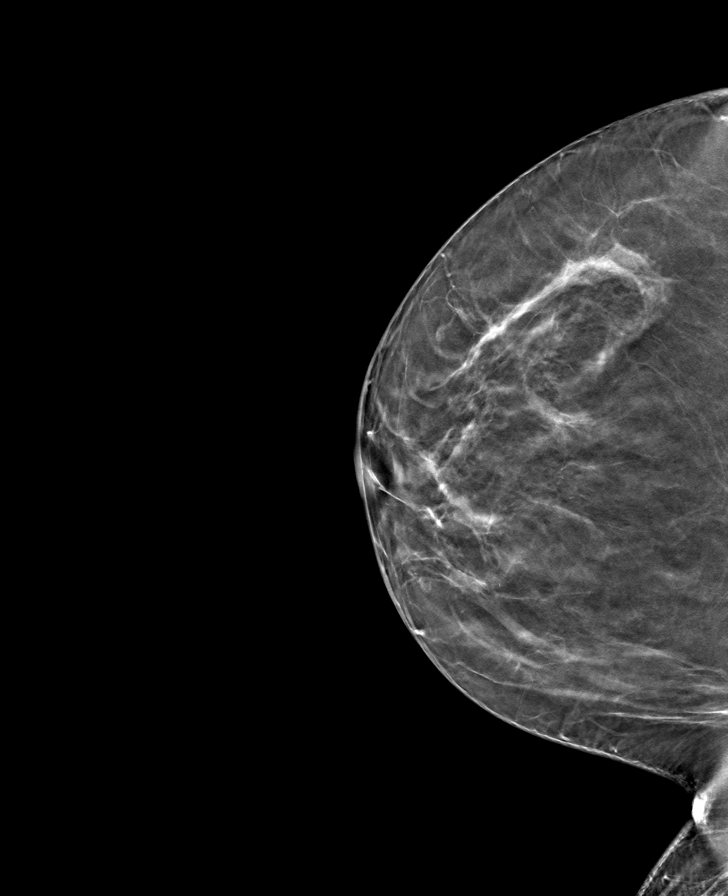

[8 of 24 positions shown; findings below may reference images not displayed]

ACR Breast Density Category b: There are scattered areas of
fibroglandular density.
FINDINGS: There are no findings suspicious for malignancy.
IMPRESSION: No mammographic evidence of malignancy. A result letter of this
screening mammogram will be mailed directly to the patient.

RECOMMENDATION:
Screening mammogram in one year. (Code:51-O-LD2)

BI-RADS CATEGORY  1: Negative.

## 2023-12-23 IMAGING — US US SOFT TISSUE HEAD/NECK
1 series · 14 of 25 positions shown · non-contrast
Comparison: None.

CLINICAL DATA: Left cervical lymphadenopathy.

EXAM:
ULTRASOUND OF HEAD/NECK SOFT TISSUES
TECHNIQUE: Ultrasound examination of the head and neck soft tissues was
performed in the area of clinical concern.

[Series 1: us soft tissue head/neck · 0.07mm/px · 31 acquisitions, 14 frames shown]
[im 1/31]
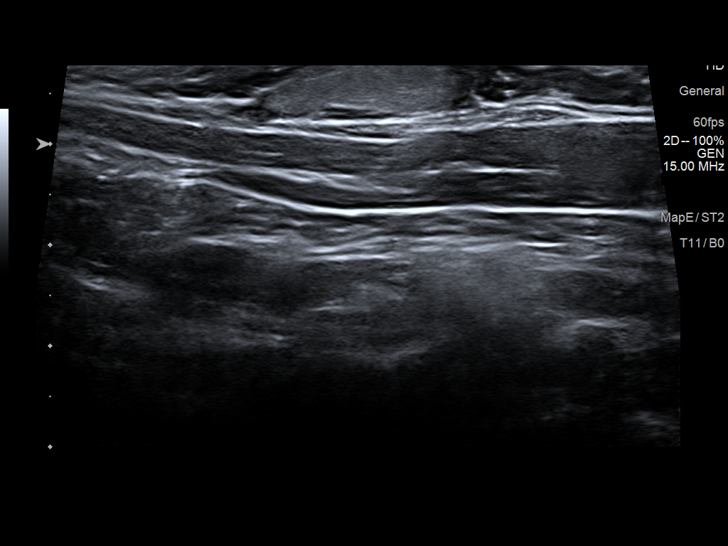
[im 3/31]
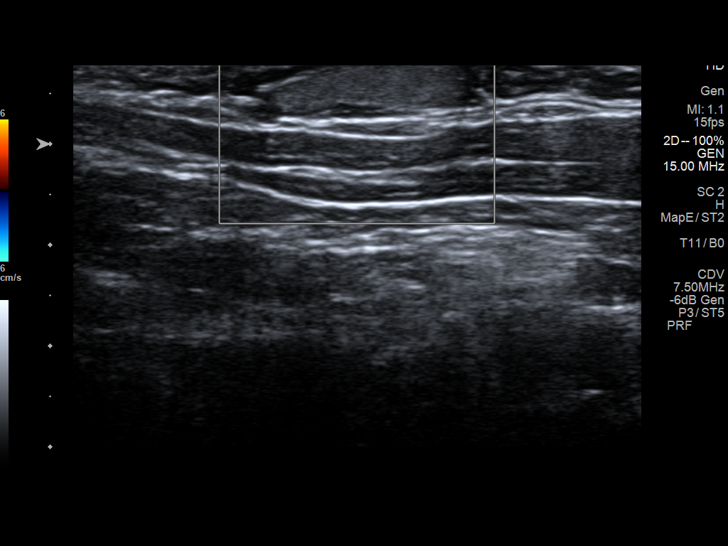
[im 6/31]
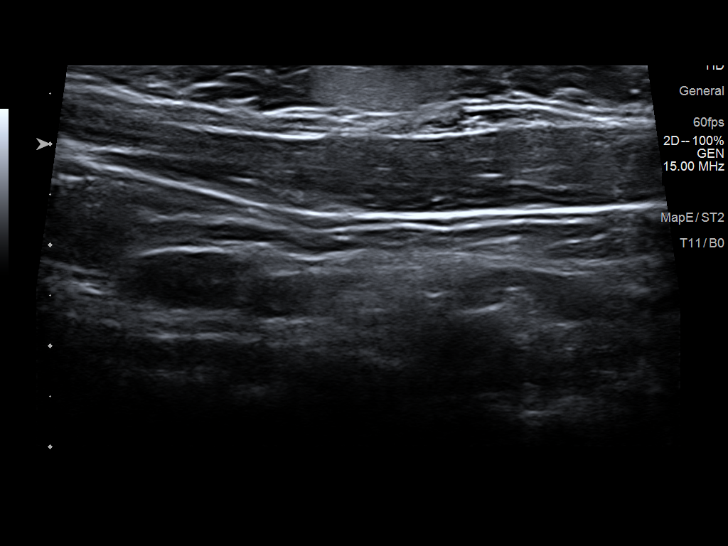
[im 8/31]
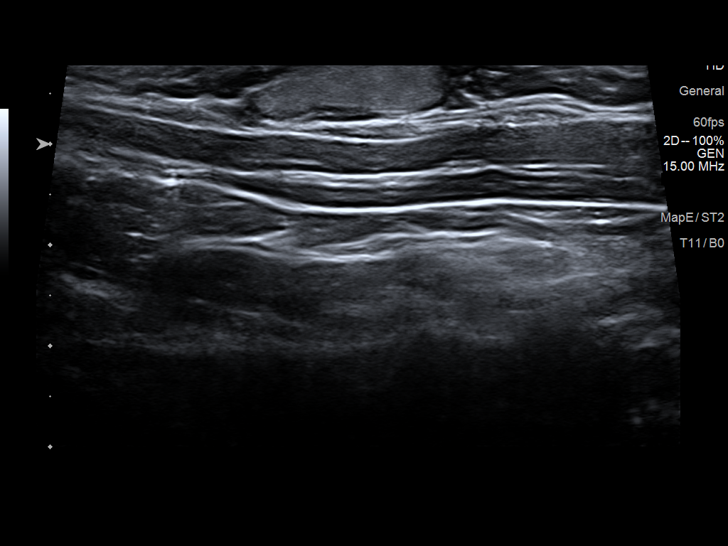
[im 11/31]
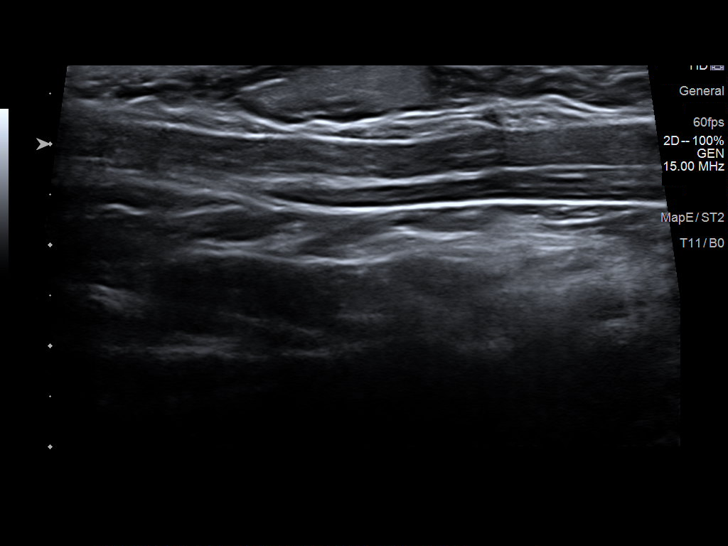
[im 12/31]
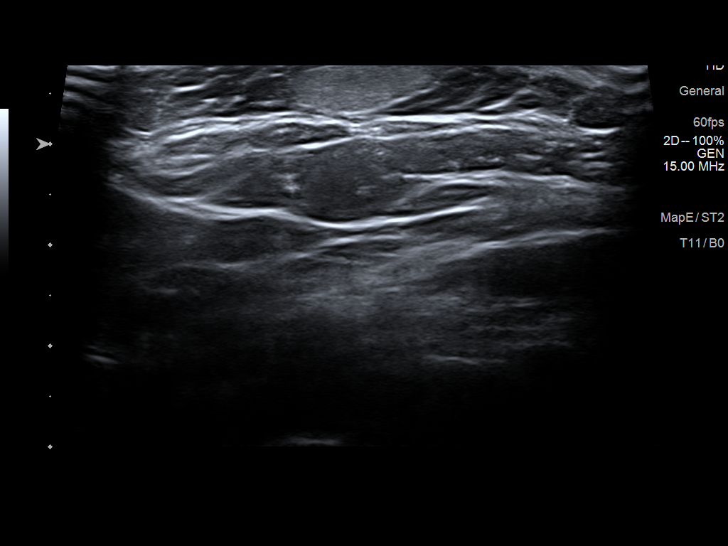
[im 14/31]
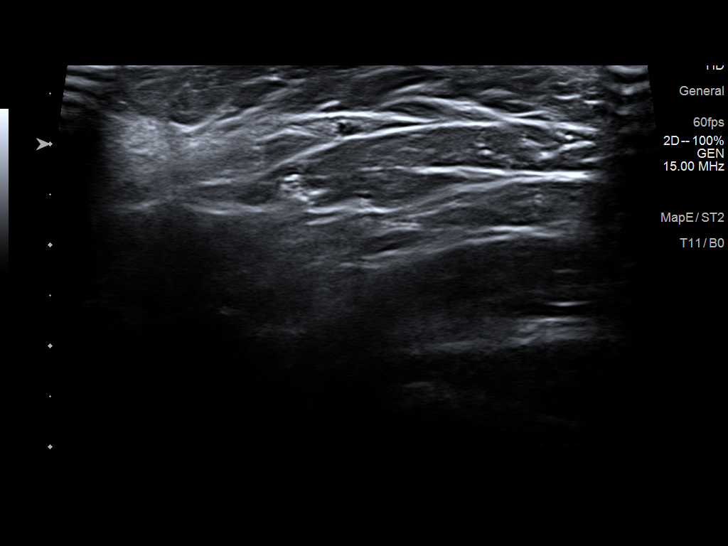
[im 17/31]
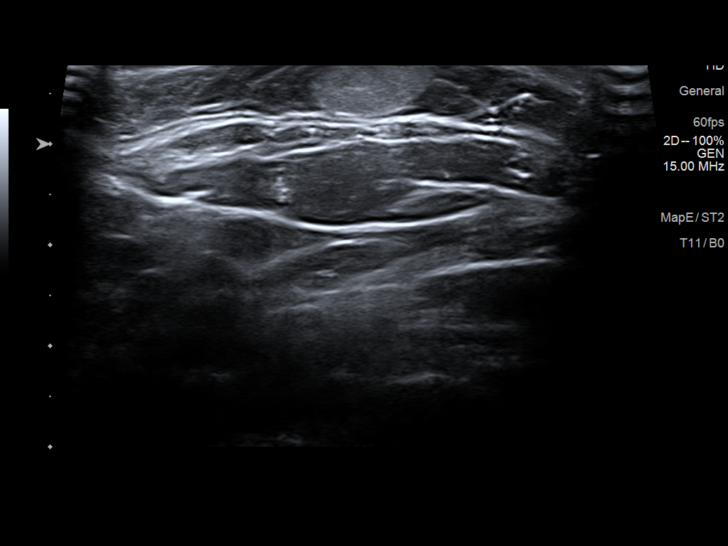
[im 19/31]
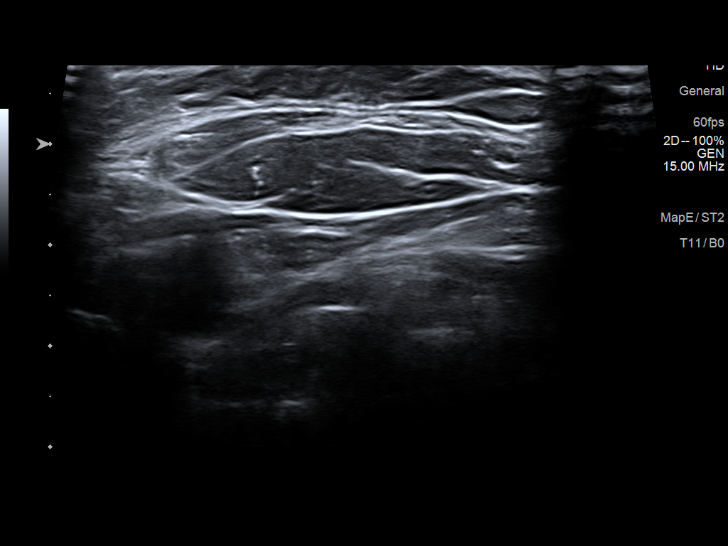
[im 21/31]
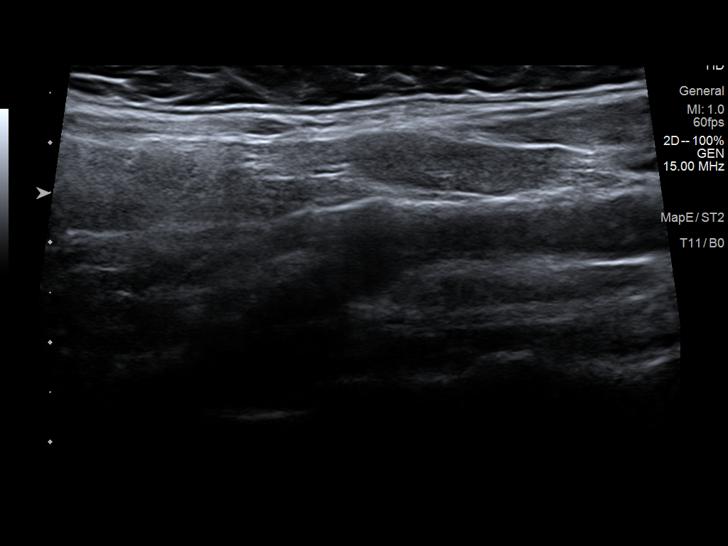
[im 23/31]
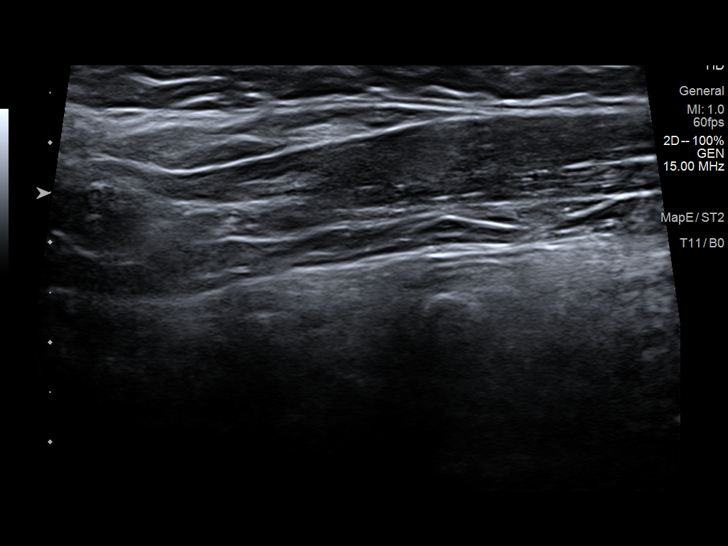
[im 26/31]
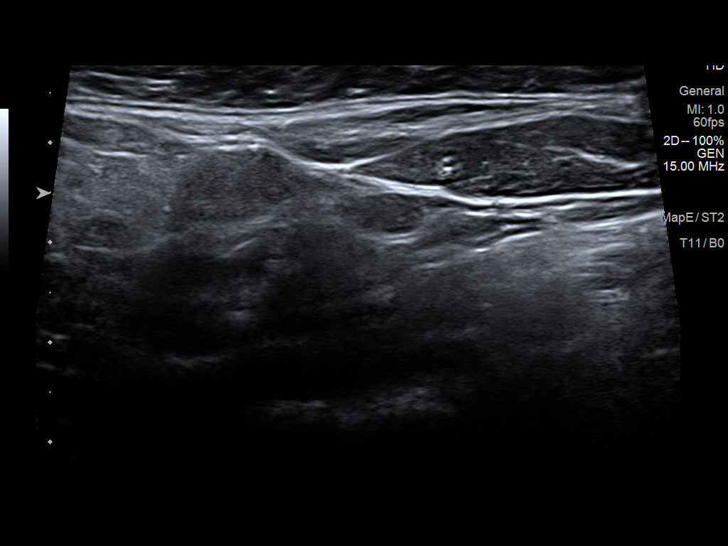
[im 28/31]
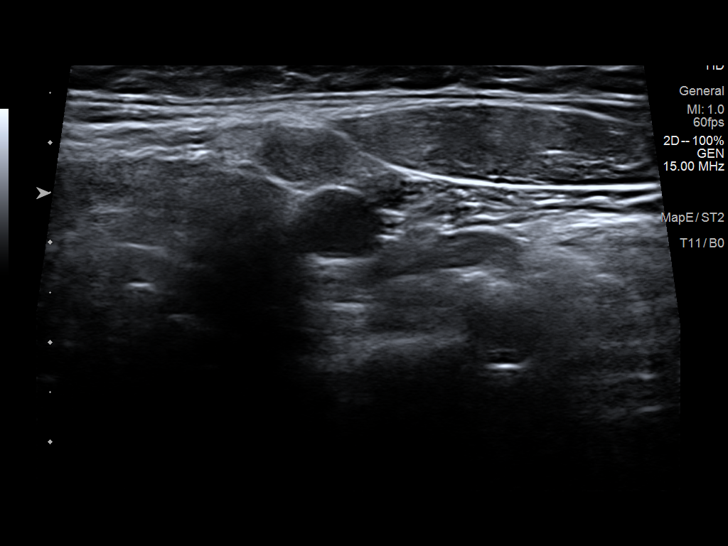
[im 31/31]
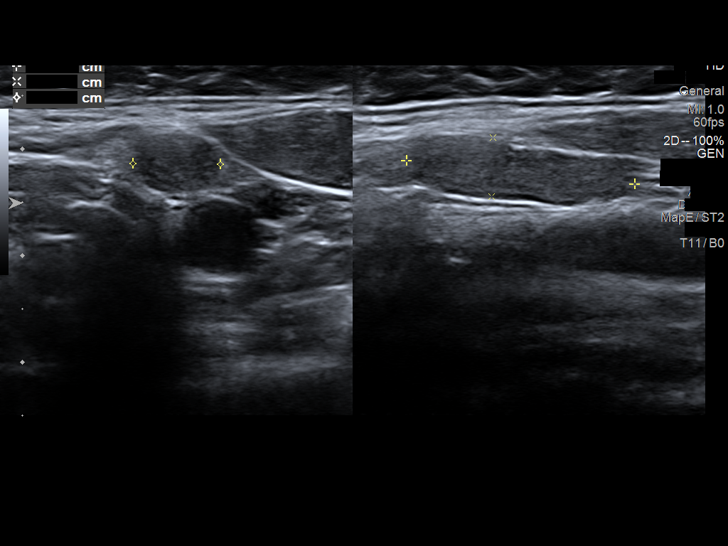

[14 of 25 positions shown; findings below may reference images not displayed]

FINDINGS: In the area of palpable concern in the left neck is a 20 x 5 x 15 mm
subcutaneous mass which is hyperechoic relative to adjacent
subcutaneous fat and contains a few echogenic longitudinal
striations. No significant internal vascularity is evident on color
Doppler imaging. There are likely a few small adjacent lymph nodes
in the left neck measuring up to 6 mm in short axis.
IMPRESSION: 2 cm subcutaneous left neck mass favored to represent a lipoma.

## 2024-02-12 ENCOUNTER — Other Ambulatory Visit: Payer: Self-pay

## 2024-02-12 NOTE — Telephone Encounter (Signed)
 Copied from CRM [id number]. Topic: Clinical - Medication Refill >> Feb 12, 2024 11:33 AM Deborah Coleman wrote: Most Recent Primary Care Visit:   Medication: venlafaxine XR [facility]   Has the patient contacted their pharmacy? No, the patient is in a different area and needs a different pharmacy and also was told she needs a physical before this could be refilled  (Agent: If no, request that the patient contact the pharmacy for the refill. If patient does not wish to contact the pharmacy document the reason why and proceed with request.) (Agent: If yes, when and what did the pharmacy advise?)  Is this the correct pharmacy for this prescription? No If no, delete pharmacy and type the correct one.  This is the patient's preferred pharmacy:  [facility] [place] [address] [phone]   Has the prescription been filled recently? Yes  Is the patient out of the medication? No  Has the patient been seen for an appointment in the last year OR does the patient have an upcoming appointment? Yes  Can we respond through MyChart? Yes  Agent: Please be advised that Rx refills may take up to 3 business days. We ask that you follow-up with your pharmacy.

## 2024-02-12 NOTE — Telephone Encounter (Signed)
 Pt. Reports she is currently "in between providers and I;ll need a refill on my medication." Instructed she can go to UC or do  a [facility] VV. Verbalizes understanding.

## 2024-02-15 ENCOUNTER — Telehealth: Admitting: Family Medicine

## 2024-02-15 DIAGNOSIS — F339 Major depressive disorder, recurrent, unspecified: Secondary | ICD-10-CM | POA: Diagnosis not present

## 2024-02-15 DIAGNOSIS — F411 Generalized anxiety disorder: Secondary | ICD-10-CM

## 2024-02-15 MED ORDER — VENLAFAXINE HCL ER 150 MG PO CP24: 150.0000 mg | ORAL_CAPSULE | Freq: Every day | ORAL | 0 refills | Status: DC

## 2024-02-15 NOTE — Progress Notes (Signed)
 Virtual Visit Consent   [name], you are scheduled for a virtual visit with a [facility] provider today. Just as with appointments in the office, your consent must be obtained to participate. Your consent will be active for this visit and any virtual visit you may have with one of our providers in the next 365 days. If you have a MyChart account, a copy of this consent can be sent to you electronically.  As this is a virtual visit, video technology does not allow for your provider to perform a traditional examination. This may limit your provider's ability to fully assess your condition. If your provider identifies any concerns that need to be evaluated in person or the need to arrange testing (such as labs, EKG, etc.), we will make arrangements to do so. Although advances in technology are sophisticated, we cannot ensure that it will always work on either your end or our end. If the connection with a video visit is poor, the visit may have to be switched to a telephone visit. With either a video or telephone visit, we are not always able to ensure that we have a secure connection.  By engaging in this virtual visit, you consent to the provision of healthcare and authorize for your insurance to be billed (if applicable) for the services provided during this visit. Depending on your insurance coverage, you may receive a charge related to this service.  I need to obtain your verbal consent now. Are you willing to proceed with your visit today? [name] has provided verbal consent on 02/15/2024 for a virtual visit (video or telephone). Freddy Finner, NP  Date: 02/15/2024 3:31 PM   Virtual Visit via Video Note   I, Freddy Finner, connected with  Nasim Borah  ([id number], [birth year]) on 02/15/24 at  3:30 PM EST by a video-enabled telemedicine application and verified that I am speaking with the correct person using two identifiers.  Location: Patient: Virtual Visit Location Patient:  Home Provider: Virtual Visit Location Provider: Home Office   I discussed the limitations of evaluation and management by telemedicine and the availability of in person appointments. The patient expressed understanding and agreed to proceed.    History of Present Illness: Deborah Coleman is a 48 y.o. who identifies as a female who was assigned female at birth, and is being seen today for refill of meds  Needs a prescription, refill. She just switched school districts and locations. Does have a new PCP, but she is unable to see me until [place] [address]; She is in need of med renewal for Venlafaxine ER 150mg   to get her to her next appt.   Problems:  Patient Active Problem List   Diagnosis Date Noted   Colon cancer screening    Polyp of descending colon     Allergies: No Known Allergies Medications:  Current Outpatient Medications:    albuterol (VENTOLIN HFA) 108 (90 Base) MCG/ACT inhaler, Inhale 1-2 puffs into the lungs every 6 (six) hours as needed for wheezing or shortness of breath. (Patient not taking: Reported on 01/17/2022), Disp: 8 g, Rfl: 0   EPIDUO FORTE 0.3-2.5 % GEL, Apply 1 application topically at bedtime. (Patient not taking: Reported on 01/17/2022), Disp: , Rfl:    levETIRAcetam (KEPPRA) 500 MG tablet, Take 2 tablets in the morning and 1 tablet in the evening, Disp: , Rfl:    levonorgestrel-ethinyl estradiol (SEASONALE) 0.15-0.03 MG tablet, Take 1 tablet by mouth daily. (Patient not taking: Reported on 01/17/2022), Disp: ,  Rfl:    lisinopril-hydrochlorothiazide (ZESTORETIC) 10-12.5 MG tablet, Take 1 tablet by mouth daily., Disp: , Rfl:    promethazine-dextromethorphan (PROMETHAZINE-DM) 6.25-15 MG/5ML syrup, Take 5 mLs by mouth 4 (four) times daily as needed for cough. (Patient not taking: Reported on 01/17/2022), Disp: 118 mL, Rfl: 0   venlafaxine XR (EFFEXOR-XR) 150 MG 24 hr capsule, Take 150 mg by mouth daily., Disp: , Rfl:   Observations/Objective: Patient is well-developed,  well-nourished in no acute distress.  Resting comfortably  at home.  Head is normocephalic, atraumatic.  No labored breathing.  Speech is clear and coherent with logical content.  Patient is alert and oriented at baseline.      02/15/2024    3:34 PM  GAD 7 : Generalized Anxiety Score  Nervous, Anxious, on Edge 1  Control/stop worrying 1  Worry too much - different things 1  Trouble relaxing 1  Restless 1  Easily annoyed or irritable 1  Afraid - awful might happen 0  Total GAD 7 Score 6  Anxiety Difficulty Somewhat difficult        02/15/2024    3:33 PM  Depression screen PHQ 2/9  Decreased Interest 0  Down, Depressed, Hopeless 0  PHQ - 2 Score 0      Assessment and Plan:     1. Depression, recurrent (HCC) (Primary)  - venlafaxine XR (EFFEXOR-XR) 150 MG 24 hr capsule; Take 1 capsule (150 mg total) by mouth daily.  Dispense: 31 capsule; Refill: 0  2. GAD (generalized anxiety disorder)  - venlafaxine XR (EFFEXOR-XR) 150 MG 24 hr capsule; Take 1 capsule (150 mg total) by mouth daily.  Dispense: 31 capsule; Refill: 0   -on good dose, reports tolerating -denies SI/HI -needs to have enough to get to [place] [facility]    Reviewed side effects, risks and benefits of medication.    Patient acknowledged agreement and understanding of the plan.   Past Medical, Surgical, Social History, Allergies, and Medications have been Reviewed.   Follow Up Instructions: I discussed the assessment and treatment plan with the patient. The patient was provided an opportunity to ask questions and all were answered. The patient agreed with the plan and demonstrated an understanding of the instructions.  A copy of instructions were sent to the patient via MyChart unless otherwise noted below.    The patient was advised to call back or seek an in-person evaluation if the symptoms worsen or if the condition fails to improve as anticipated.    Freddy Finner, NP

## 2024-02-15 NOTE — Patient Instructions (Signed)
  Shaqueta Gaida, thank you for joining Freddy Finner, NP for today's virtual visit.  While this provider is not your primary care provider (PCP), if your PCP is located in our provider database this encounter information will be shared with them immediately following your visit.   A [facility] MyChart account gives you access to today's visit and all your visits, tests, and labs performed at [facility] " click here if you don't have a [facility] MyChart account or go to [email or web address]  Consent: (Patient) Deborah Coleman provided verbal consent for this virtual visit at the beginning of the encounter.  Current Medications:  Current Outpatient Medications:    albuterol (VENTOLIN HFA) 108 (90 Base) MCG/ACT inhaler, Inhale 1-2 puffs into the lungs every 6 (six) hours as needed for wheezing or shortness of breath. (Patient not taking: Reported on 01/17/2022), Disp: 8 g, Rfl: 0   EPIDUO FORTE 0.3-2.5 % GEL, Apply 1 application topically at bedtime. (Patient not taking: Reported on 01/17/2022), Disp: , Rfl:    levETIRAcetam (KEPPRA) 500 MG tablet, Take 2 tablets in the morning and 1 tablet in the evening, Disp: , Rfl:    levonorgestrel-ethinyl estradiol (SEASONALE) 0.15-0.03 MG tablet, Take 1 tablet by mouth daily. (Patient not taking: Reported on 01/17/2022), Disp: , Rfl:    lisinopril-hydrochlorothiazide (ZESTORETIC) 10-12.5 MG tablet, Take 1 tablet by mouth daily., Disp: , Rfl:    promethazine-dextromethorphan (PROMETHAZINE-DM) 6.25-15 MG/5ML syrup, Take 5 mLs by mouth 4 (four) times daily as needed for cough. (Patient not taking: Reported on 01/17/2022), Disp: 118 mL, Rfl: 0   venlafaxine XR (EFFEXOR-XR) 150 MG 24 hr capsule, Take 1 capsule (150 mg total) by mouth daily., Disp: 31 capsule, Rfl: 0   Medications ordered in this encounter:  Meds ordered this encounter  Medications   venlafaxine XR (EFFEXOR-XR) 150 MG 24 hr capsule    Sig: Take 1 capsule (150 mg total) by mouth  daily.    Dispense:  31 capsule    Refill:  0    Supervising Provider:   Merrilee Jansky [[id number]]     *If you need refills on other medications prior to your next appointment, please contact your pharmacy*  Follow-Up: Call back or seek an in-person evaluation if the symptoms worsen or if the condition fails to improve as anticipated.  [facility] [phone]  Other Instructions  Continue dose and keep appt   If you have been instructed to have an in-person evaluation today at a local Urgent Care facility, please use the link below. It will take you to a list of all of our available [facility], including address, phone number and hours of operation. Please do not delay care.  [facility]  If you or a family member do not have a primary care provider, use the link below to schedule a visit and establish care. When you choose a [facility] primary care physician or advanced practice provider, you gain a long-term partner in health. Find a Primary Care Provider  Learn more about [facility]'s in-office and virtual care options: [facility] - Get Care Now

## 2024-03-22 ENCOUNTER — Ambulatory Visit: Payer: Self-pay | Admitting: Family Medicine

## 2024-03-22 ENCOUNTER — Encounter: Payer: Self-pay | Admitting: Family Medicine

## 2024-03-22 VITALS — BP 133/88 | HR 82 | Ht 60.0 in | Wt 177.4 lb

## 2024-03-22 DIAGNOSIS — J302 Other seasonal allergic rhinitis: Secondary | ICD-10-CM

## 2024-03-22 DIAGNOSIS — F411 Generalized anxiety disorder: Secondary | ICD-10-CM

## 2024-03-22 DIAGNOSIS — G40409 Other generalized epilepsy and epileptic syndromes, not intractable, without status epilepticus: Secondary | ICD-10-CM | POA: Diagnosis not present

## 2024-03-22 DIAGNOSIS — Z7689 Persons encountering health services in other specified circumstances: Secondary | ICD-10-CM | POA: Insufficient documentation

## 2024-03-22 DIAGNOSIS — I10 Essential (primary) hypertension: Secondary | ICD-10-CM

## 2024-03-22 DIAGNOSIS — R87613 High grade squamous intraepithelial lesion on cytologic smear of cervix (HGSIL): Secondary | ICD-10-CM | POA: Insufficient documentation

## 2024-03-22 DIAGNOSIS — G40109 Localization-related (focal) (partial) symptomatic epilepsy and epileptic syndromes with simple partial seizures, not intractable, without status epilepticus: Secondary | ICD-10-CM

## 2024-03-22 DIAGNOSIS — F339 Major depressive disorder, recurrent, unspecified: Secondary | ICD-10-CM

## 2024-03-22 DIAGNOSIS — Z1231 Encounter for screening mammogram for malignant neoplasm of breast: Secondary | ICD-10-CM

## 2024-03-22 DIAGNOSIS — G4733 Obstructive sleep apnea (adult) (pediatric): Secondary | ICD-10-CM

## 2024-03-22 MED ORDER — LISINOPRIL-HYDROCHLOROTHIAZIDE 10-12.5 MG PO TABS: 1.0000 | ORAL_TABLET | Freq: Every day | ORAL | 0 refills | Status: DC

## 2024-03-22 MED ORDER — LAMOTRIGINE ER 300 MG PO TB24: 300.0000 mg | ORAL_TABLET | Freq: Every day | ORAL | 1 refills | Status: AC

## 2024-03-22 MED ORDER — VENLAFAXINE HCL ER 150 MG PO CP24: 150.0000 mg | ORAL_CAPSULE | Freq: Every day | ORAL | 0 refills | Status: DC

## 2024-03-22 MED ORDER — LEVETIRACETAM 500 MG PO TABS: ORAL_TABLET | ORAL | 1 refills | Status: DC

## 2024-03-22 NOTE — Assessment & Plan Note (Addendum)
 Reports no grand mal seizures in last ten years. Continue levetiracetam and lamotrigine. Patient will be scheduling an appointment with Dr. Sherryll Burger after leaving today.

## 2024-03-22 NOTE — Patient Instructions (Addendum)
 Call to schedule appointment with neurology.  Please call the [facility] [phone]) to schedule a routine screening mammogram.      Check your blood pressure once daily, and any time you have concerning symptoms like headache, chest pain, dizziness, shortness of breath, or vision changes.   Our goal is less than 130/80.  To appropriately check your blood pressure, make sure you do the following:  1) Avoid caffeine, exercise, or tobacco products for 30 minutes before checking. Empty your bladder. 2) Sit with your back supported in a flat-backed chair. Rest your arm on something flat (arm of the chair, table, etc). 3) Sit still with your feet flat on the floor, resting, for at least 5 minutes.  4) Check your blood pressure. Take 1-2 readings.  5) Write down these readings and bring with you to any provider appointments.  Bring your home blood pressure machine with you to a provider's office for accuracy comparison at least once a year.   Make sure you take your blood pressure medications before you come to any office visit, even if you were asked to fast for labs.

## 2024-03-22 NOTE — Assessment & Plan Note (Signed)
 Taking OTC zyrtec.

## 2024-03-22 NOTE — Assessment & Plan Note (Signed)
 Does not remember when her last focal seizure was. Continue levetiracetam and lamotrigine. Patient will be scheduling an appointment with Dr. Sherryll Burger after leaving today.

## 2024-03-22 NOTE — Progress Notes (Signed)
 New patient visit   Patient: Deborah Coleman   DOB: 26-Sep-1976   48 y.o. Female  MRN: [id number] Visit Date: 03/22/2024  Today's healthcare provider: Sherlyn Hay, DO   Chief Complaint  Patient presents with   New Patient (Initial Visit)   Subjective    Deborah Coleman is a 48 y.o. female who presents today as a new patient to establish care.   HPI  Deborah Coleman is a 48 year old female with sleep apnea and obesity who presents to establish care and address medication refills.  She has obstructive sleep apnea and uses a CPAP machine but struggles to keep it on throughout the night. Her boyfriend often wakes her up due to episodes of apnea. She has tried three different mask types without success. She experiences difficulty sleeping, describing it as never reaching deep sleep and feeling constantly tired. She goes to bed early but wakes up frequently throughout the night.  She has a history of seizures and is currently taking Keppra 1000 mg in the morning and 500 mg in the evening, along with lamotrigine, which has helped manage her seizures. She has not experienced any grand mal seizures in the last ten years, though she occasionally has focal seizures, which she attributes to stress and sleep deprivation.  She has a history of depression managed with venlafaxine. She experiences seasonal allergies, particularly in the spring, and takes over-the-counter Zyrtec for relief.  Hypertension is managed with lisinopril-hydrochlorothiazide. She reports no recent issues with her blood pressure since starting medication.  She has a history of high-grade dysplasia and underwent a colposcopy in the past. Her last Pap smear in 2021 was negative for HPV.  She moved back from [place] and is currently working as a Veterinary surgeon. She has three children and is in a relationship with a boyfriend who had cancer.    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  Had colposcopy.   Past Medical History:  Diagnosis Date   Depression    High grade squamous intraepithelial lesion (HGSIL) on cytologic smear of cervix 03/22/2024   Hypertension    Seizures (HCC)    not for 10 years   Sleep apnea    no CPAP   Past Surgical History:  Procedure Laterality Date   COLONOSCOPY WITH PROPOFOL N/A 02/10/2022   Procedure: COLONOSCOPY WITH BIOPSY;  Surgeon: Midge Minium, MD;  Location: [facility];  Service: Endoscopy;  Laterality: N/A;   POLYPECTOMY N/A 02/10/2022   Procedure: POLYPECTOMY;  Surgeon: Midge Minium, MD;  Location: [facility];  Service: Endoscopy;  Laterality: N/A;   Family Status  Relation Name Status   Mother  Alive   Sister  (Not Specified)   Daughter  (Not Specified)   Mat Aunt  (Not Specified)   Mat Uncle  (Not Specified)   MGM  (Not Specified)   Neg Hx  (Not Specified)  No partnership data on file   Family History  Problem Relation Age of Onset   Hypertension Mother    Bipolar disorder Mother    Bipolar disorder Sister    Bipolar disorder Daughter    Diabetes Maternal Aunt    Hypertension Maternal Aunt    Diabetes Maternal Uncle    Diabetes Maternal Grandmother  Hypertension Maternal Grandmother    Breast cancer Neg Hx    Social History   Socioeconomic History   Marital status: Single    Spouse name: Not on file   Number of children: Not on file   Years of education: Not on file   Highest education level: Not on file  Occupational History   Not on file  Tobacco Use   Smoking status: Never   Smokeless tobacco: Never  Vaping Use   Vaping status: Never Used  Substance and Sexual Activity   Alcohol use: Yes    Comment: occasionally   Drug use: Never    Sexual activity: Not on file  Other Topics Concern   Not on file  Social History Narrative   Not on file   Social Drivers of Health   Financial Resource Strain: Low Risk  (05/28/2020)   Received from [facility]   Overall Financial Resource Strain (CARDIA)    Difficulty of Paying Living Expenses: Not hard at all  Food Insecurity: No Food Insecurity (05/28/2020)   Received from [facility]   Hunger Vital Sign    Worried About Running Out of Food in the Last Year: Never true    Ran Out of Food in the Last Year: Never true  Transportation Needs: No Transportation Needs (05/28/2020)   Received from [facility] - Transportation    In the past 12 months, has lack of transportation kept you from medical appointments or from getting medications?: No    Lack of Transportation (Non-Medical): No  Physical Activity: Inactive (05/28/2020)   Received from [facility]   Exercise Vital Sign    Days of Exercise per Week: 0 days    Minutes of Exercise per Session: 0 min  Stress: Stress Concern Present (05/28/2020)   Received from [facility] - Occupational Stress Questionnaire    Feeling of Stress : Rather much  Social Connections: Socially Isolated (05/28/2020)   Received from [facility]   Social Connection and Isolation Panel [NHANES]    Frequency of Communication with Friends and Family: Once a week    Frequency of Social Gatherings with Friends and Family: Once a week    Attends Religious Services: Never    Database administrator or Organizations: No    Attends Banker Meetings: Never    Marital Status: Living with partner   Outpatient Medications Prior to Visit  Medication Sig   [DISCONTINUED] LamoTRIgine 300 MG TB24 24 hour tablet Take 300 mg by mouth daily.   [DISCONTINUED] levETIRAcetam (KEPPRA) 500 MG tablet Take 2  tablets in the morning and 1 tablet in the evening   [DISCONTINUED] lisinopril-hydrochlorothiazide (ZESTORETIC) 10-12.5 MG tablet Take 1 tablet by mouth daily.   [DISCONTINUED] venlafaxine XR (EFFEXOR-XR) 150 MG 24 hr capsule Take 1 capsule (150 mg total) by mouth daily.   [DISCONTINUED] albuterol (VENTOLIN HFA) 108 (90 Base) MCG/ACT inhaler Inhale 1-2 puffs into the lungs every 6 (six) hours as needed for wheezing or shortness of breath. (Patient not taking: Reported on 01/17/2022)   [DISCONTINUED] EPIDUO FORTE 0.3-2.5 % GEL Apply 1 application topically at bedtime. (Patient not taking: Reported on 01/17/2022)   [DISCONTINUED] levonorgestrel-ethinyl estradiol (SEASONALE) 0.15-0.03 MG tablet Take 1 tablet by mouth daily. (Patient not taking: Reported on 01/17/2022)   [DISCONTINUED] promethazine-dextromethorphan (PROMETHAZINE-DM) 6.25-15 MG/5ML syrup Take 5 mLs by mouth 4 (four) times  daily as needed for cough. (Patient not taking: Reported on 01/17/2022)   No facility-administered medications prior to visit.   No Known Allergies  Immunization History  Administered Date(s) Administered   Moderna Sars-Covid-2 Vaccination 02/22/2020, 03/21/2020, 11/10/2020   Tdap 04/27/2017    Health Maintenance  Topic Date Due   HIV Screening  Never done   Hepatitis C Screening  Never done   COVID-19 Vaccine (4 - 2024-25 season) 09/14/2024 (Originally 08/16/2023)   Cervical Cancer Screening (HPV/Pap Cotest)  03/22/2025 (Originally 06/01/2023)   INFLUENZA VACCINE  07/15/2024   DTaP/Tdap/Td (2 - Td or Tdap) 04/28/2027   Colonoscopy  02/11/2032   HPV VACCINES  Aged Out    Patient Care Team: Caress Reffitt N, DO as PCP - General (Family Medicine)       Objective    BP 133/88   Pulse 82   Ht 5' (1.524 m)   Wt 177 lb 6.4 oz (80.5 kg)   SpO2 99%   BMI 34.65 kg/m     Physical Exam Vitals and nursing note reviewed.  Constitutional:      General: She is not in acute distress.    Appearance: Normal  appearance.  HENT:     Head: Normocephalic and atraumatic.  Eyes:     General: No scleral icterus.    Conjunctiva/sclera: Conjunctivae normal.  Cardiovascular:     Rate and Rhythm: Normal rate.  Pulmonary:     Effort: Pulmonary effort is normal.  Neurological:     Mental Status: She is alert and oriented to person, place, and time. Mental status is at baseline.  Psychiatric:        Mood and Affect: Mood normal.        Behavior: Behavior normal.     Depression Screen    03/22/2024   10:03 AM 02/15/2024    3:33 PM  PHQ 2/9 Scores  PHQ - 2 Score 0 0  PHQ- 9 Score 9    No results found for any visits on 03/22/24.  Assessment & Plan     Seizure disorder, grand mal (HCC) Assessment & Plan: Reports no grand mal seizures in last ten years. Continue levetiracetam and lamotrigine. Patient will be scheduling an appointment with Dr. Sherryll Burger after leaving today.  Orders: -     levETIRAcetam; Take 2 tablets (1,000 mg total) by mouth in the morning AND 1 tablet (500 mg total) at bedtime. Take 2 tablets in the morning and 1 tablet in the evening.  Dispense: 270 tablet; Refill: 1 -     lamoTRIgine ER; Take 1 tablet (300 mg total) by mouth daily.  Dispense: 90 tablet; Refill: 1  Establishing care with new doctor, encounter for  Focal seizure with experiential sensory symptoms [facility]) Assessment & Plan: Does not remember when her last focal seizure was. Continue levetiracetam and lamotrigine. Patient will be scheduling an appointment with Dr. Sherryll Burger after leaving today.  Orders: -     levETIRAcetam; Take 2 tablets (1,000 mg total) by mouth in the morning AND 1 tablet (500 mg total) at bedtime. Take 2 tablets in the morning and 1 tablet in the evening.  Dispense: 270 tablet; Refill: 1 -     lamoTRIgine ER; Take 1 tablet (300 mg total) by mouth daily.  Dispense: 90 tablet; Refill: 1  Essential hypertension -     Lisinopril-hydroCHLOROthiazide; Take 1 tablet by mouth daily.  Dispense: 90 tablet;  Refill: 0  Obstructive sleep apnea -     Cpap titration; Future  Depression, recurrent (HCC) -     Venlafaxine HCl ER; Take 1 capsule (150 mg total) by mouth daily.  Dispense: 31 capsule; Refill: 0  GAD (generalized anxiety disorder) -     Venlafaxine HCl ER; Take 1 capsule (150 mg total) by mouth daily.  Dispense: 31 capsule; Refill: 0  Seasonal allergies Assessment & Plan: Taking OTC zyrtec.   Encounter for screening mammogram for breast cancer -     3D Screening Mammogram, Left and Right; Future     Seizure Disorder Seizure disorder stable with Keppra and lamotrigine. No grand mal seizures in ten years, occasional focal seizures related to stress and sleep deprivation. - Continue Keppra 1000 mg in the morning and 500 mg in the evening. - Continue lamotrigine. - Schedule appointment with neurologist Dr. Clelia Croft for ongoing management.  Hypertension Hypertension managed with lisinopril and hydrochlorothiazide. Blood pressure control requires monitoring, no issues since starting medication. - Prescribe lisinopril and hydrochlorothiazide. - Advise purchase of home blood pressure cuff and monitor blood pressure at home. - Provide instructions on proper blood pressure monitoring technique. - Schedule follow-up appointment in 4-6 weeks to reassess blood pressure control.  Obstructive Sleep Apnea Difficulty tolerating CPAP therapy due to mask issues and anxiety. Weight contributes to apnea severity. - Discuss alternative CPAP mask options with a sleep specialist. - Encourage weight loss as a long-term management strategy.  Depression/anxiety Depression managed with venlafaxine, stable with no changes needed. - Continue venlafaxine.  Seasonal Allergies Seasonal allergies exacerbated by spring pollen, managed with Zyrtec. - Continue Zyrtec, recommend taking it at night.    Establishing care with a new doctor, encounter for Up to date with COVID-19 and tetanus vaccines. Last  Pap smear in 2021, due for mammogram and Pap smear next year. - Schedule mammogram. - Ensure documentation of recent tetanus vaccine from 2024. - Plan for Pap smear next year.    Return in about 6 weeks (around 05/03/2024) for CPE.     I discussed the assessment and treatment plan with the patient  The patient was provided an opportunity to ask questions and all were answered. The patient agreed with the plan and demonstrated an understanding of the instructions.   The patient was advised to call back or seek an in-person evaluation if the symptoms worsen or if the condition fails to improve as anticipated.    Sherlyn Hay, DO  [facility] [phone] (phone) [phone] (fax)  [facility]

## 2024-03-24 ENCOUNTER — Encounter: Payer: Self-pay | Admitting: Family Medicine

## 2024-03-25 ENCOUNTER — Encounter

## 2024-04-04 ENCOUNTER — Ambulatory Visit: Admitting: Sleep Medicine

## 2024-04-04 ENCOUNTER — Encounter: Payer: Self-pay | Admitting: Sleep Medicine

## 2024-04-04 VITALS — BP 130/82 | HR 87 | Ht 60.0 in | Wt 175.5 lb

## 2024-04-04 DIAGNOSIS — E66811 Obesity, class 1: Secondary | ICD-10-CM

## 2024-04-04 DIAGNOSIS — G40802 Other epilepsy, not intractable, without status epilepticus: Secondary | ICD-10-CM | POA: Diagnosis not present

## 2024-04-04 DIAGNOSIS — Z6834 Body mass index (BMI) 34.0-34.9, adult: Secondary | ICD-10-CM

## 2024-04-04 DIAGNOSIS — I10 Essential (primary) hypertension: Secondary | ICD-10-CM

## 2024-04-04 DIAGNOSIS — G4733 Obstructive sleep apnea (adult) (pediatric): Secondary | ICD-10-CM | POA: Diagnosis not present

## 2024-04-04 NOTE — Patient Instructions (Signed)

## 2024-04-04 NOTE — Progress Notes (Signed)
 Name:Deborah Coleman MRN: [id number] DOB: July 03, 1976   CHIEF COMPLAINT:  ESTABLISH CARE FOR OSA   HISTORY OF PRESENT ILLNESS:  Deborah Coleman is a 48 y.o. w/ a h/o who presents to establish care for OSA. Reports that she was initially diagnosed with OSA in 2019 and was subsequently started on CPAP therapy. Reports a 15 lb weight gain over the last several years. States that she has been using CPAP therapy inconsistently due to mask discomfort. She has been using a full face mask. Admits to loud snoring and witnessed apnea without CPAP therapy. Admits to excessive daytime sleepiness. Reports nocturnal awakenings due to nocturia, however does not have difficulty falling back to sleep. Admits to dry mouth and headaches. Denies RLS symptoms, dream enactment, cataplexy, hypnagogic or hypnapompic hallucinations. Reports a family history of sleep apnea. Occasional drowsy driving. Drinks 1 cup of coffee daily, occasional alcohol use, denies tobacco or illicit drug use.   Bedtime 7-8 pm Sleep onset 15 mins Rise time 4:30 am   EPWORTH SLEEP SCORE 11    04/04/2024    3:37 PM  Results of the Epworth flowsheet  Sitting and reading 3  Watching TV 2  Sitting, inactive in a public place (e.g. a theatre or a meeting) 0  As a passenger in a car for an hour without a break 3  Lying down to rest in the afternoon when circumstances permit 3  Sitting and talking to someone 0  Sitting quietly after a lunch without alcohol 0  In a car, while stopped for a few minutes in traffic 0  Total score 11     PAST MEDICAL HISTORY :   has a past medical history of Depression, High grade squamous intraepithelial lesion (HGSIL) on cytologic smear of cervix (03/22/2024), Hypertension, Seizures (HCC), and Sleep apnea.  has a past surgical history that includes Colonoscopy with propofol  (N/A, 02/10/2022) and polypectomy (N/A, 02/10/2022). Prior to Admission medications   Medication Sig Start Date End Date Taking?  Authorizing Provider  LamoTRIgine  300 MG TB24 24 hour tablet Take 1 tablet (300 mg total) by mouth daily. 03/22/24  Yes Pardue, Asencion Blacksmith, DO  levETIRAcetam  (KEPPRA ) 500 MG tablet Take 2 tablets (1,000 mg total) by mouth in the morning AND 1 tablet (500 mg total) at bedtime. Take 2 tablets in the morning and 1 tablet in the evening. 03/22/24  Yes Pardue, Asencion Blacksmith, DO  lisinopril -hydrochlorothiazide  (ZESTORETIC ) 10-12.5 MG tablet Take 1 tablet by mouth daily. 03/22/24  Yes Pardue, Asencion Blacksmith, DO  venlafaxine  XR (EFFEXOR -XR) 150 MG 24 hr capsule Take 1 capsule (150 mg total) by mouth daily. 03/22/24  Yes Pardue, Asencion Blacksmith, DO   No Known Allergies  FAMILY HISTORY:  family history includes Bipolar disorder in her daughter, mother, and sister; Diabetes in her maternal aunt, maternal grandmother, and maternal uncle; Hypertension in her maternal aunt, maternal grandmother, and mother. SOCIAL HISTORY:  reports that she has never smoked. She has never used smokeless tobacco. She reports current alcohol use. She reports that she does not use drugs.   Review of Systems:  Gen:  Denies  fever, sweats, chills weight loss  HEENT: Denies blurred vision, double vision, ear pain, eye pain, hearing loss, nose bleeds, sore throat Cardiac:  No dizziness, chest pain or heaviness, chest tightness,edema, No JVD Resp:   No cough, -sputum production, -shortness of breath,-wheezing, -hemoptysis,  Gi: Denies swallowing difficulty, stomach pain, nausea or vomiting, diarrhea, constipation, bowel incontinence Gu:  Denies bladder incontinence,  burning urine Ext:   Denies Joint pain, stiffness or swelling Skin: Denies  skin rash, easy bruising or bleeding or hives Endoc:  Denies polyuria, polydipsia , polyphagia or weight change Psych:   Denies depression, insomnia or hallucinations  Other:  All other systems negative  VITAL SIGNS: BP 130/82   Pulse 87   Ht 5' (1.524 m)   Wt 175 lb 8 oz (79.6 kg)   SpO2 93%   BMI 34.27 kg/m     Physical Examination:   General Appearance: No distress  EYES PERRLA, EOM intact.   NECK Supple, No JVD Pulmonary: normal breath sounds, No wheezing.  CardiovascularNormal S1,S2.  No m/r/g.   Abdomen: Benign, Soft, non-tender. Skin:   warm, no rashes, no ecchymosis  Extremities: normal, no cyanosis, clubbing. Neuro:without focal findings,  speech normal  PSYCHIATRIC: Mood, affect within normal limits.   ASSESSMENT AND PLAN  OSA For mask discomfort, fit patient with the Airtouch N30i nasal mask. Counseled patient on the importance of using CPAP therapy every night. Discussed the consequences of untreated sleep apnea. Advised not to drive drowsy for safety of patient and others. Will follow up in 3 months.    HTN Stable, on current management. Following with PCP.   Epilepsy Stable, on current management.  Obesity Counseled patient on diet and lifestyle modification.    Patient  satisfied with Plan of action and management. All questions answered  I spent a total of 32 minutes reviewing chart data, face-to-face evaluation with the patient, counseling and coordination of care as detailed above.    Kylie Gros, M.D.  Sleep Medicine [facility]

## 2024-04-06 ENCOUNTER — Ambulatory Visit
Admission: RE | Admit: 2024-04-06 | Discharge: 2024-04-06 | Disposition: A | Discharge status: Home / Self Care | Source: Ambulatory Visit | Attending: Family Medicine | Admitting: Family Medicine

## 2024-04-06 DIAGNOSIS — Z1231 Encounter for screening mammogram for malignant neoplasm of breast: Secondary | ICD-10-CM | POA: Insufficient documentation

## 2024-04-16 ENCOUNTER — Other Ambulatory Visit: Payer: Self-pay | Admitting: Family Medicine

## 2024-04-16 DIAGNOSIS — F339 Major depressive disorder, recurrent, unspecified: Secondary | ICD-10-CM

## 2024-04-16 DIAGNOSIS — F411 Generalized anxiety disorder: Secondary | ICD-10-CM

## 2024-04-18 ENCOUNTER — Encounter: Payer: Self-pay | Admitting: Family Medicine

## 2024-05-03 ENCOUNTER — Encounter: Payer: Self-pay | Admitting: Family Medicine

## 2024-05-03 ENCOUNTER — Ambulatory Visit: Admitting: Family Medicine

## 2024-05-03 VITALS — BP 123/73 | HR 88 | Temp 99.0°F | Ht 60.0 in | Wt 177.0 lb

## 2024-05-03 DIAGNOSIS — E781 Pure hyperglyceridemia: Secondary | ICD-10-CM | POA: Diagnosis not present

## 2024-05-03 DIAGNOSIS — D179 Benign lipomatous neoplasm, unspecified: Secondary | ICD-10-CM

## 2024-05-03 DIAGNOSIS — I10 Essential (primary) hypertension: Secondary | ICD-10-CM | POA: Diagnosis not present

## 2024-05-03 DIAGNOSIS — G4733 Obstructive sleep apnea (adult) (pediatric): Secondary | ICD-10-CM | POA: Diagnosis not present

## 2024-05-03 DIAGNOSIS — Z0000 Encounter for general adult medical examination without abnormal findings: Secondary | ICD-10-CM | POA: Diagnosis not present

## 2024-05-03 MED ORDER — LISINOPRIL-HYDROCHLOROTHIAZIDE 10-12.5 MG PO TABS: 1.0000 | ORAL_TABLET | Freq: Every day | ORAL | 1 refills | Status: AC

## 2024-05-03 MED ORDER — ZEPBOUND 2.5 MG/0.5ML ~~LOC~~ SOAJ: 2.5000 mg | SUBCUTANEOUS | 1 refills | Status: AC

## 2024-05-03 NOTE — Patient Instructions (Signed)
 For Zepbound, by the end of week 3 or right as you give yourself the fourth shot, please send a message letting me know if you tolerating it well and would like to increase to the next dose.

## 2024-05-03 NOTE — Progress Notes (Signed)
 Complete physical exam   Patient: Deborah Coleman   DOB: 08-02-1976   48 y.o. Female  MRN: [id number] Visit Date: 05/03/2024  Today's healthcare provider: Carlean Charter, DO   Chief Complaint  Patient presents with   Annual Exam    Patient would like to discuss having prescription for Zepbound .  Her neurologist has recommended this due to her sleep apnea.  Sleep study was done through neurolobist office.   Subjective    Deborah Coleman is a 48 y.o. female who presents today for a complete physical exam.  She reports consuming a general diet and is trying to add salads into her diet for lunch. She walking at a higher intensity once per week. She generally feels fairly well. She reports sleeping fairly well, better than before with new CPAP mask. She does still knock it off at night sometimes She does not have additional problems to discuss today.   HPI HPI     Annual Exam    Additional comments: Patient would like to discuss having prescription for Zepbound .  Her neurologist has recommended this due to her sleep apnea.  Sleep study was done through neurolobist office.      Last edited by Eustaquio Hight, CMA on 05/03/2024  1:29 PM.       Deborah Coleman is a 48 year old female who presents for a physical exam and follow-up on sleep apnea management.  Her sleep has improved since receiving a new nasal mask from a pulmonologist, which she finds more comfortable. She also uses mouth tape to prevent mouth breathing. Despite these improvements, she occasionally removes the mask at night without fully waking up. She attributes some sleep disturbances to stress from life transitions.  Her son was recently diagnosed with idiopathic thrombocytopenic purpura (ITP) after experiencing a prolonged epistaxis and a low platelet count, leading to a brief hospitalization for further evaluation. There is no known family history of similar issues on her side.  She is currently taking lisinopril   and hydrochlorothiazide  for blood pressure management. She mentions a past mix-up with her medication, mistaking hydrochlorothiazide  for an allergy pill, but her blood pressure has remained stable. She occasionally takes Zyrtec for allergies.  Her diet is inconsistent, often eating salads for lunch but resorting to fast food due to her busy schedule. She has a strong craving for cheese and acknowledges the need to be more mindful of her eating habits. She does not eat breakfast regularly and often skips it or has cereal.  Physical activity is limited to purposeful walking about once a week. She hopes to increase her activity level once her living situation stabilizes. She is in the process of moving and currently commutes long distances for work, which she finds stressful and time-consuming.  She notes an increase in ear wax and has a lipoma that she feels is getting slightly larger but is not painful.    Past Medical History:  Diagnosis Date   Depression    High grade squamous intraepithelial lesion (HGSIL) on cytologic smear of cervix 03/22/2024   Hypertension    Seizures (HCC)    not for 10 years   Sleep apnea    no CPAP   Past Surgical History:  Procedure Laterality Date   COLONOSCOPY WITH PROPOFOL  N/A 02/10/2022   Procedure: COLONOSCOPY WITH BIOPSY;  Surgeon: Marnee Sink, MD;  Location: [facility];  Service: Endoscopy;  Laterality: N/A;   POLYPECTOMY N/A 02/10/2022   Procedure: POLYPECTOMY;  Surgeon: Marnee Sink,  MD;  Location: [facility];  Service: Endoscopy;  Laterality: N/A;   Social History   Socioeconomic History   Marital status: Single    Spouse name: Not on file   Number of children: Not on file   Years of education: Not on file   Highest education level: Professional school degree (e.g., MD, DDS, DVM, JD)  Occupational History   Not on file  Tobacco Use   Smoking status: Never   Smokeless tobacco: Never  Vaping Use   Vaping status: Never Used   Substance and Sexual Activity   Alcohol use: Yes    Comment: occasionally   Drug use: Never   Sexual activity: Not on file  Other Topics Concern   Not on file  Social History Narrative   Not on file   Social Drivers of Health   Financial Resource Strain: Low Risk  (05/02/2024)   Overall Financial Resource Strain (CARDIA)    Difficulty of Paying Living Expenses: Not very hard  Food Insecurity: No Food Insecurity (05/02/2024)   Hunger Vital Sign    Worried About Running Out of Food in the Last Year: Never true    Ran Out of Food in the Last Year: Never true  Transportation Needs: No Transportation Needs (05/02/2024)   PRAPARE - Administrator, Civil Service (Medical): No    Lack of Transportation (Non-Medical): No  Physical Activity: Insufficiently Active (05/02/2024)   Exercise Vital Sign    Days of Exercise per Week: 1 day    Minutes of Exercise per Session: 20 min  Stress: No Stress Concern Present (05/02/2024)   [employer] of Occupational Health - Occupational Stress Questionnaire    Feeling of Stress : Only a little  Social Connections: Socially Isolated (05/02/2024)   Social Connection and Isolation Panel [NHANES]    Frequency of Communication with Friends and Family: More than three times a week    Frequency of Social Gatherings with Friends and Family: Once a week    Attends Religious Services: Never    Database administrator or Organizations: No    Attends Engineer, structural: Not on file    Marital Status: Divorced  Catering manager Violence: Not on file   Family Status  Relation Name Status   Mother  Alive   Sister  (Not Specified)   Daughter  (Not Specified)   Son  (Not Specified)   Mat Aunt  (Not Specified)   Mat Uncle  (Not Specified)   MGM  (Not Specified)   Neg Hx  (Not Specified)  No partnership data on file   Family History  Problem Relation Age of Onset   Hypertension Mother    Bipolar disorder Mother    Bipolar disorder  Sister    Bipolar disorder Daughter    Thrombocytopenia Son    Diabetes Maternal Aunt    Hypertension Maternal Aunt    Diabetes Maternal Uncle    Diabetes Maternal Grandmother    Hypertension Maternal Grandmother    Breast cancer Neg Hx    No Known Allergies  Patient Care Team: Pheonix Clinkscale, Asencion Blacksmith, DO as PCP - General (Family Medicine)   Medications: Outpatient Medications Prior to Visit  Medication Sig   LamoTRIgine  300 MG TB24 24 hour tablet Take 1 tablet (300 mg total) by mouth daily.   levETIRAcetam  (KEPPRA ) 500 MG tablet Take 2 tablets (1,000 mg total) by mouth in the morning AND 1 tablet (500 mg total) at bedtime. Take 2 tablets  in the morning and 1 tablet in the evening.   venlafaxine  XR (EFFEXOR -XR) 150 MG 24 hr capsule TAKE 1 CAPSULE BY MOUTH EVERY DAY   [DISCONTINUED] lisinopril -hydrochlorothiazide  (ZESTORETIC ) 10-12.5 MG tablet Take 1 tablet by mouth daily.   No facility-administered medications prior to visit.    Review of Systems  Constitutional:  Negative for chills, fatigue and fever.  HENT:  Negative for congestion, ear pain, rhinorrhea, sneezing and sore throat.   Eyes: Negative.  Negative for pain and redness.  Respiratory:  Negative for cough, shortness of breath and wheezing.   Cardiovascular:  Negative for chest pain and leg swelling.  Gastrointestinal:  Negative for abdominal pain, blood in stool, constipation, diarrhea and nausea.  Endocrine: Negative for polydipsia and polyphagia.  Genitourinary: Negative.  Negative for dysuria, flank pain, hematuria, pelvic pain, vaginal bleeding and vaginal discharge.  Musculoskeletal:  Negative for arthralgias, back pain, gait problem and joint swelling.  Skin:  Negative for rash.  Neurological: Negative.  Negative for dizziness, tremors, seizures, weakness, light-headedness, numbness and headaches.  Hematological:  Negative for adenopathy.  Psychiatric/Behavioral: Negative.  Negative for behavioral problems, confusion and  dysphoric mood. The patient is not nervous/anxious and is not hyperactive.       Objective    BP 123/73 (BP Location: Left Arm, Patient Position: Sitting, Cuff Size: Normal)   Pulse 88   Temp 99 F (37.2 C) (Oral)   Ht 5' (1.524 m)   Wt 177 lb (80.3 kg)   LMP 03/21/2024   SpO2 99%   BMI 34.57 kg/m    Physical Exam Vitals and nursing note reviewed.  Constitutional:      General: She is awake.     Appearance: Normal appearance.  HENT:     Head: Normocephalic and atraumatic.     Right Ear: Tympanic membrane, ear canal and external ear normal.     Left Ear: Tympanic membrane, ear canal and external ear normal.     Nose: Nose normal.     Mouth/Throat:     Mouth: Mucous membranes are moist.     Pharynx: Oropharynx is clear. No oropharyngeal exudate or posterior oropharyngeal erythema.  Eyes:     General: No scleral icterus.    Extraocular Movements: Extraocular movements intact.     Conjunctiva/sclera: Conjunctivae normal.     Pupils: Pupils are equal, round, and reactive to light.  Neck:     Thyroid: No thyromegaly or thyroid tenderness.  Cardiovascular:     Rate and Rhythm: Normal rate and regular rhythm.     Pulses: Normal pulses.     Heart sounds: Normal heart sounds.  Pulmonary:     Effort: Pulmonary effort is normal. No tachypnea, bradypnea or respiratory distress.     Breath sounds: Normal breath sounds. No stridor. No wheezing, rhonchi or rales.  Abdominal:     General: Bowel sounds are normal. There is no distension.     Palpations: Abdomen is soft. There is no mass.     Tenderness: There is no abdominal tenderness. There is no guarding.     Hernia: No hernia is present.  Musculoskeletal:     Cervical back: Normal range of motion and neck supple.     Right lower leg: No edema.     Left lower leg: No edema.  Lymphadenopathy:     Cervical: No cervical adenopathy.  Skin:    General: Skin is warm and dry.  Neurological:     Mental Status: She is alert and  oriented  to person, place, and time. Mental status is at baseline.  Psychiatric:        Mood and Affect: Mood normal.        Behavior: Behavior normal.      Last depression screening scores    05/03/2024    1:30 PM 03/22/2024   10:03 AM 02/15/2024    3:33 PM  PHQ 2/9 Scores  PHQ - 2 Score 0 0 0  PHQ- 9 Score 3 9    Last fall risk screening    05/03/2024    1:30 PM  Fall Risk   Falls in the past year? 0  Number falls in past yr: 0  Injury with Fall? 0   Last Audit-C alcohol use screening    05/02/2024   12:57 PM  Alcohol Use Disorder Test (AUDIT)  1. How often do you have a drink containing alcohol? 1  2. How many drinks containing alcohol do you have on a typical day when you are drinking? 0  3. How often do you have six or more drinks on one occasion? 0  AUDIT-C Score 1      Patient-reported   A score of 3 or more in women, and 4 or more in men indicates increased risk for alcohol abuse, EXCEPT if all of the points are from question 1   No results found for any visits on 05/03/24.  Assessment & Plan    Routine Health Maintenance and Physical Exam  Exercise Activities and Dietary recommendations  Goals   None     Immunization History  Administered Date(s) Administered   Moderna Sars-Covid-2 Vaccination 02/22/2020, 03/21/2020, 11/10/2020   Tdap 04/27/2017    Health Maintenance  Topic Date Due   COVID-19 Vaccine (4 - 2024-25 season) 09/14/2024 (Originally 08/16/2023)   INFLUENZA VACCINE  07/15/2024   Cervical Cancer Screening (HPV/Pap Cotest)  05/31/2025   DTaP/Tdap/Td (2 - Td or Tdap) 04/28/2027   Colonoscopy  02/11/2032   Hepatitis C Screening  Completed   HIV Screening  Completed   HPV VACCINES  Aged Out   Meningococcal B Vaccine  Aged Out    Discussed health benefits of physical activity, and encouraged her to engage in regular exercise appropriate for her age and condition.   Annual physical exam  Obstructive sleep apnea -     Zepbound ; Inject 2.5  mg into the skin once a week.  Dispense: 2 mL; Refill: 1  Essential hypertension -     Lisinopril -hydroCHLOROthiazide ; Take 1 tablet by mouth daily.  Dispense: 90 tablet; Refill: 1  Hypertriglyceridemia -     Lipid panel  Lipoma, unspecified site     Annual physical exam Physical exam overall unremarkable except as noted above. Routine lab work ordered as noted (lipid panel).  Other labs up to date in Care Everywhere; submitted abstraction request.   Obstructive sleep apnea Improved sleep with nasal mask and mouth tape. Occasionally removes mask at night. Discussed Zepbound  for management with potential side effects and dose adjustments. - Prescribe Zepbound  with instructions to increase dose every four weeks if tolerated. - Instruct to send a message by the end of week three or after the fourth injection if doing well and would like to increase the dose. - Provide one refill of Zepbound  in case the next dose is not available.  Hypertension Blood pressure well-controlled. Previous medication mix-up resolved. - Continue lisinopril -hydrochlorothiazide  10-12.5 mg daily. - Order lipid panel.  Lipoma Lipoma possibly increasing in size but not painful. Recommended continued monitoring  without intervention unless causing discomfort. - Monitor lipoma as it is not causing discomfort.    Return in about 6 months (around 11/03/2024) for Chronic f/u.     I discussed the assessment and treatment plan with the patient  The patient was provided an opportunity to ask questions and all were answered. The patient agreed with the plan and demonstrated an understanding of the instructions.   The patient was advised to call back or seek an in-person evaluation if the symptoms worsen or if the condition fails to improve as anticipated.    Carlean Charter, DO  [facility] [phone] (phone) [phone] (fax)  [facility]

## 2024-05-16 DIAGNOSIS — G4733 Obstructive sleep apnea (adult) (pediatric): Secondary | ICD-10-CM | POA: Insufficient documentation

## 2024-05-16 DIAGNOSIS — R7309 Other abnormal glucose: Secondary | ICD-10-CM | POA: Insufficient documentation

## 2024-05-30 ENCOUNTER — Other Ambulatory Visit: Payer: Self-pay | Admitting: Family Medicine

## 2024-05-31 ENCOUNTER — Encounter: Payer: Self-pay | Admitting: Family Medicine

## 2024-05-31 LAB — LIPID PANEL
Chol/HDL Ratio: 5.5 ratio — ABNORMAL HIGH (ref 0.0–4.4)
Cholesterol, Total: 231 mg/dL — ABNORMAL HIGH (ref 100–199)
HDL: 42 mg/dL (ref >39)
LDL Chol Calc (NIH): 116 mg/dL — ABNORMAL HIGH (ref 0–99)
Triglycerides: 417 mg/dL — ABNORMAL HIGH (ref 0–149)
VLDL Cholesterol Cal: 73 mg/dL — ABNORMAL HIGH (ref 5–40)

## 2024-06-08 ENCOUNTER — Ambulatory Visit: Payer: Self-pay | Admitting: Family Medicine

## 2024-07-05 ENCOUNTER — Ambulatory Visit: Admitting: Sleep Medicine

## 2024-07-29 ENCOUNTER — Other Ambulatory Visit: Payer: Self-pay | Admitting: Medical Genetics

## 2024-07-30 ENCOUNTER — Other Ambulatory Visit

## 2024-09-14 ENCOUNTER — Encounter (INDEPENDENT_AMBULATORY_CARE_PROVIDER_SITE_OTHER): Payer: Self-pay

## 2024-09-16 ENCOUNTER — Other Ambulatory Visit: Payer: Self-pay | Admitting: Family Medicine

## 2024-09-16 DIAGNOSIS — G40109 Localization-related (focal) (partial) symptomatic epilepsy and epileptic syndromes with simple partial seizures, not intractable, without status epilepticus: Secondary | ICD-10-CM

## 2024-09-16 DIAGNOSIS — G40409 Other generalized epilepsy and epileptic syndromes, not intractable, without status epilepticus: Secondary | ICD-10-CM

## 2024-10-17 ENCOUNTER — Other Ambulatory Visit: Payer: Self-pay | Admitting: Medical Genetics

## 2024-10-17 DIAGNOSIS — Z006 Encounter for examination for normal comparison and control in clinical research program: Secondary | ICD-10-CM

## 2024-10-18 ENCOUNTER — Other Ambulatory Visit: Payer: Self-pay | Admitting: Family Medicine

## 2024-10-18 ENCOUNTER — Encounter: Payer: Self-pay | Admitting: Family Medicine

## 2024-10-18 DIAGNOSIS — F339 Major depressive disorder, recurrent, unspecified: Secondary | ICD-10-CM

## 2024-10-18 DIAGNOSIS — F411 Generalized anxiety disorder: Secondary | ICD-10-CM

## 2024-10-18 NOTE — Telephone Encounter (Signed)
 LOV- 05/03/2024 NOV- 11/04/2024 LRF- 04/18/2024 Outpatient Medication Detail   Disp Refills Start End   venlafaxine  XR (EFFEXOR -XR) 150 MG 24 hr capsule 90 capsule 1 04/18/2024 --   Sig - Route: TAKE 1 CAPSULE BY MOUTH EVERY DAY - Oral   Sent to pharmacy as: venlafaxine  XR (EFFEXOR -XR) 150 MG 24 hr capsule   E-Prescribing Status: Receipt confirmed by pharmacy (04/18/2024  4:15 PM EDT)

## 2024-10-21 ENCOUNTER — Encounter: Payer: Self-pay | Admitting: Family Medicine

## 2024-10-21 DIAGNOSIS — F411 Generalized anxiety disorder: Secondary | ICD-10-CM

## 2024-10-21 DIAGNOSIS — F339 Major depressive disorder, recurrent, unspecified: Secondary | ICD-10-CM

## 2024-10-21 MED ORDER — VENLAFAXINE HCL ER 150 MG PO CP24: ORAL_CAPSULE | ORAL | 1 refills | Status: AC

## 2024-11-04 ENCOUNTER — Ambulatory Visit: Admitting: Family Medicine

## 2024-12-10 LAB — GENECONNECT MOLECULAR SCREEN: Genetic Analysis Overall Interpretation: NEGATIVE

## 2024-12-20 ENCOUNTER — Ambulatory Visit: Admitting: Family Medicine

## 2024-12-20 DIAGNOSIS — I10 Essential (primary) hypertension: Secondary | ICD-10-CM

## 2024-12-20 DIAGNOSIS — G40409 Other generalized epilepsy and epileptic syndromes, not intractable, without status epilepticus: Secondary | ICD-10-CM

## 2024-12-20 DIAGNOSIS — R7309 Other abnormal glucose: Secondary | ICD-10-CM

## 2024-12-20 DIAGNOSIS — E781 Pure hyperglyceridemia: Secondary | ICD-10-CM
